# Patient Record
Sex: Female | Born: 1960
Health system: Southern US, Community
[De-identification: ages and names within clinical notes are randomized; demographics above are authoritative.]

## PROBLEM LIST (undated history)

## (undated) DIAGNOSIS — Z86711 Personal history of pulmonary embolism: Secondary | ICD-10-CM

## (undated) DIAGNOSIS — D369 Benign neoplasm, unspecified site: Secondary | ICD-10-CM

## (undated) DIAGNOSIS — D6851 Activated protein C resistance: Secondary | ICD-10-CM

## (undated) DIAGNOSIS — K635 Polyp of colon: Secondary | ICD-10-CM

## (undated) DIAGNOSIS — K8020 Calculus of gallbladder without cholecystitis without obstruction: Secondary | ICD-10-CM

## (undated) DIAGNOSIS — I82409 Acute embolism and thrombosis of unspecified deep veins of unspecified lower extremity: Secondary | ICD-10-CM

## (undated) DIAGNOSIS — E7800 Pure hypercholesterolemia, unspecified: Secondary | ICD-10-CM

## (undated) DIAGNOSIS — I10 Essential (primary) hypertension: Secondary | ICD-10-CM

## (undated) DIAGNOSIS — K602 Anal fissure, unspecified: Secondary | ICD-10-CM

## (undated) DIAGNOSIS — F419 Anxiety disorder, unspecified: Secondary | ICD-10-CM

## (undated) DIAGNOSIS — F338 Other recurrent depressive disorders: Secondary | ICD-10-CM

## (undated) DIAGNOSIS — J189 Pneumonia, unspecified organism: Secondary | ICD-10-CM

## (undated) DIAGNOSIS — K589 Irritable bowel syndrome without diarrhea: Secondary | ICD-10-CM

## (undated) HISTORY — DX: Anxiety disorder, unspecified: F41.9

## (undated) HISTORY — DX: Personal history of pulmonary embolism: Z86.711

## (undated) HISTORY — DX: Benign neoplasm, unspecified site: D36.9

## (undated) HISTORY — DX: Other recurrent depressive disorders: F33.8

## (undated) HISTORY — DX: Calculus of gallbladder without cholecystitis without obstruction: K80.20

## (undated) HISTORY — DX: Activated protein C resistance: D68.51

## (undated) HISTORY — DX: Essential (primary) hypertension: I10

## (undated) HISTORY — PX: POLYPECTOMY: SHX149

## (undated) HISTORY — PX: COLONOSCOPY: SHX174

## (undated) HISTORY — DX: Polyp of colon: K63.5

## (undated) HISTORY — PX: DILATION AND CURETTAGE OF UTERUS: SHX78

## (undated) HISTORY — DX: Pneumonia, unspecified organism: J18.9

## (undated) HISTORY — DX: Anal fissure, unspecified: K60.2

## (undated) HISTORY — DX: Acute embolism and thrombosis of unspecified deep veins of unspecified lower extremity: I82.409

## (undated) HISTORY — DX: Pure hypercholesterolemia, unspecified: E78.00

## (undated) HISTORY — DX: Irritable bowel syndrome, unspecified: K58.9

---
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
## 1998-10-24 HISTORY — PX: APPENDECTOMY: SHX54

## 1998-12-06 ENCOUNTER — Emergency Department (HOSPITAL_COMMUNITY): Admission: EM | Admit: 1998-12-06 | Discharge: 1998-12-06 | Payer: Self-pay | Admitting: Emergency Medicine

## 1998-12-09 ENCOUNTER — Inpatient Hospital Stay (HOSPITAL_COMMUNITY): Admission: EM | Admit: 1998-12-09 | Discharge: 1999-01-03 | Payer: Self-pay | Admitting: Emergency Medicine

## 1998-12-09 ENCOUNTER — Encounter: Payer: Self-pay | Admitting: Emergency Medicine

## 1998-12-10 ENCOUNTER — Encounter (HOSPITAL_BASED_OUTPATIENT_CLINIC_OR_DEPARTMENT_OTHER): Payer: Self-pay | Admitting: General Surgery

## 1998-12-15 ENCOUNTER — Encounter (HOSPITAL_BASED_OUTPATIENT_CLINIC_OR_DEPARTMENT_OTHER): Payer: Self-pay | Admitting: General Surgery

## 1998-12-17 ENCOUNTER — Encounter (HOSPITAL_BASED_OUTPATIENT_CLINIC_OR_DEPARTMENT_OTHER): Payer: Self-pay | Admitting: General Surgery

## 1998-12-21 ENCOUNTER — Encounter (HOSPITAL_BASED_OUTPATIENT_CLINIC_OR_DEPARTMENT_OTHER): Payer: Self-pay | Admitting: General Surgery

## 1998-12-28 ENCOUNTER — Encounter (HOSPITAL_BASED_OUTPATIENT_CLINIC_OR_DEPARTMENT_OTHER): Payer: Self-pay | Admitting: General Surgery

## 1999-01-19 ENCOUNTER — Encounter: Admission: RE | Admit: 1999-01-19 | Discharge: 1999-01-19 | Payer: Self-pay | Admitting: Internal Medicine

## 1999-10-25 HISTORY — PX: INCISIONAL HERNIA REPAIR: SHX193

## 1999-10-25 HISTORY — PX: CHOLECYSTECTOMY: SHX55

## 2000-04-05 ENCOUNTER — Other Ambulatory Visit: Admission: RE | Admit: 2000-04-05 | Discharge: 2000-04-05 | Payer: Self-pay | Admitting: Obstetrics and Gynecology

## 2000-05-08 ENCOUNTER — Encounter: Payer: Self-pay | Admitting: Family Medicine

## 2000-05-08 ENCOUNTER — Encounter: Admission: RE | Admit: 2000-05-08 | Discharge: 2000-05-08 | Payer: Self-pay | Admitting: Family Medicine

## 2000-05-10 ENCOUNTER — Encounter: Payer: Self-pay | Admitting: Obstetrics and Gynecology

## 2000-05-10 ENCOUNTER — Ambulatory Visit (HOSPITAL_COMMUNITY): Admission: RE | Admit: 2000-05-10 | Discharge: 2000-05-10 | Payer: Self-pay | Admitting: Obstetrics and Gynecology

## 2000-05-18 ENCOUNTER — Encounter (INDEPENDENT_AMBULATORY_CARE_PROVIDER_SITE_OTHER): Payer: Self-pay | Admitting: Specialist

## 2000-05-18 ENCOUNTER — Observation Stay (HOSPITAL_COMMUNITY): Admission: RE | Admit: 2000-05-18 | Discharge: 2000-05-19 | Payer: Self-pay

## 2000-10-24 HISTORY — PX: OOPHORECTOMY: SHX86

## 2001-02-06 ENCOUNTER — Ambulatory Visit: Admission: RE | Admit: 2001-02-06 | Discharge: 2001-02-06 | Payer: Self-pay | Admitting: Gynecologic Oncology

## 2001-02-27 ENCOUNTER — Ambulatory Visit (HOSPITAL_COMMUNITY): Admission: RE | Admit: 2001-02-27 | Discharge: 2001-02-27 | Payer: Self-pay | Admitting: Gynecologic Oncology

## 2001-02-27 ENCOUNTER — Encounter (INDEPENDENT_AMBULATORY_CARE_PROVIDER_SITE_OTHER): Payer: Self-pay

## 2001-06-18 ENCOUNTER — Other Ambulatory Visit: Admission: RE | Admit: 2001-06-18 | Discharge: 2001-06-18 | Payer: Self-pay | Admitting: Obstetrics and Gynecology

## 2001-07-20 ENCOUNTER — Ambulatory Visit (HOSPITAL_COMMUNITY): Admission: RE | Admit: 2001-07-20 | Discharge: 2001-07-20 | Payer: Self-pay | Admitting: Obstetrics and Gynecology

## 2001-07-20 ENCOUNTER — Encounter: Payer: Self-pay | Admitting: Obstetrics and Gynecology

## 2003-03-13 ENCOUNTER — Encounter: Payer: Self-pay | Admitting: Obstetrics and Gynecology

## 2003-03-13 ENCOUNTER — Ambulatory Visit (HOSPITAL_COMMUNITY): Admission: RE | Admit: 2003-03-13 | Discharge: 2003-03-13 | Payer: Self-pay | Admitting: Obstetrics and Gynecology

## 2003-03-19 ENCOUNTER — Other Ambulatory Visit: Admission: RE | Admit: 2003-03-19 | Discharge: 2003-03-19 | Payer: Self-pay | Admitting: Obstetrics and Gynecology

## 2004-07-09 ENCOUNTER — Ambulatory Visit (HOSPITAL_COMMUNITY): Admission: RE | Admit: 2004-07-09 | Discharge: 2004-07-09 | Payer: Self-pay | Admitting: Obstetrics and Gynecology

## 2004-08-03 ENCOUNTER — Other Ambulatory Visit: Admission: RE | Admit: 2004-08-03 | Discharge: 2004-08-03 | Payer: Self-pay | Admitting: Obstetrics and Gynecology

## 2006-11-27 ENCOUNTER — Ambulatory Visit (HOSPITAL_COMMUNITY): Admission: RE | Admit: 2006-11-27 | Discharge: 2006-11-27 | Payer: Self-pay | Admitting: Obstetrics and Gynecology

## 2007-01-23 ENCOUNTER — Other Ambulatory Visit: Admission: RE | Admit: 2007-01-23 | Discharge: 2007-01-23 | Payer: Self-pay | Admitting: Obstetrics and Gynecology

## 2008-01-25 ENCOUNTER — Other Ambulatory Visit: Admission: RE | Admit: 2008-01-25 | Discharge: 2008-01-25 | Payer: Self-pay | Admitting: Obstetrics and Gynecology

## 2010-08-03 ENCOUNTER — Ambulatory Visit (HOSPITAL_COMMUNITY): Admission: RE | Admit: 2010-08-03 | Discharge: 2010-08-03 | Payer: Self-pay | Admitting: Obstetrics and Gynecology

## 2011-03-11 NOTE — Consult Note (Signed)
[facility]  Patient:    Kaitlyn Garcia, Kaitlyn Garcia                        MRN: [id number] Proc. Date: 02/06/01 Adm. Date:  [id number] Attending:  Ronita Hipps T CC:         Edwena Felty. Ashley Royalty, M.D., [facility], [address],             [address], [place], [place]  [address]  Telford Nab, R.N., GYN Oncology, [facility]   Consultation Report  HISTORY OF PRESENT ILLNESS:  The patient presents for a second opinion consultation regarding management of leiomyomata and an adnexal cyst.  This 50 year old para 3 woman presented with symptoms of intermittent discomfort and for routine examination.  Findings were negative but ultrasound revealed fibroid uterus and a 3.8-cm septated cyst.  CA125 value was normal. The patient has a friend who had an ovarian malignancy and is extremely anxious regarding these findings.  Per the ultrasound report, she had three fibroids, with the largest measuring 14 x 13 mm, and the ovarian cyst was smoothly marginated without mural nodularity.  PAST SURGICAL HISTORY:  Past history is significant for T&A, D&C, ruptured appendix, with repairs of incisional herniae including mesh.  PAST MEDICAL HISTORY:  Past medical history is significant for no major co-morbidities.  MEDICATIONS:  Medications include Modicon.  ALLERGIES:  None known.  FAMILY HISTORY:  Family history is significant for diabetes and MI but no gynecologic malignancy.  PERSONAL/SOCIAL HISTORY:  The patient denies tobacco or ethanol use, married.  REVIEW OF SYSTEMS:  Review of systems otherwise negative.  PHYSICAL EXAMINATION:  VITAL SIGNS:  Vital signs stable and afebrile as recorded in the clinic flow sheet.  GENERAL:  The patient is alert and oriented x 3.  ENT:  Benign.  LYMPHATICS:  There is no pathologic lymphadenopathy.  BACK:  There is no back or CVA tenderness.  ABDOMEN:  The abdomen is soft and benign with well-healed midline  and transverse incisions without palpable hernia but with depressed scars.  EXTREMITIES:  There is no lymphedema and extremities have full range of motion.  PELVIC:  External genitalia and BUS are normal to inspection and palpation. The bladder is well-supported and there are no vaginal lesions.  There is no cervical motion tenderness or lesions of the cervix.  Bimanual and rectovaginal examinations reveal upper-limit-in-size uterus and I am unable to palpate an adnexal mass.  There is no adnexal tenderness.  Rectovaginal examination is confirmatory.  ASSESSMENT: 1. Small adnexal cyst with small fibroids. 2. Prior peritonitis and multiple abdominopelvic surgeries.  RECOMMENDATIONS:  I had a lengthy discussion with the patient regarding management options.  I believe that her cyst is likely benign, with less than 1% chance of malignancy based on its findings.  I recommended a followup ultrasound (already scheduled) and if the cyst is stable in characteristics, this could be followed without surgery, but if it is enlarging or has worsening characteristics, symptoms, or she is extremely concerned regarding the etiology of the cyst, at least salpingo-oophorectomy should be performed. She would require a major laparotomy and has a chance for bowel morbidity from this procedure.  The patient appeared satisfied with this recommendation and will follow up with Dr. Edwena Felty. Ashley Royalty as scheduled. DD:  02/06/01 TD:  02/07/01 Job: [id number]

## 2011-03-11 NOTE — Op Note (Signed)
[facility]  Patient:    Kaitlyn Garcia, Kaitlyn Garcia                        MRN: [id number] Proc. Date: 05/18/00 Adm. Date:  [id number] Disc. Date: [id number] Attending:  Gennie Alma CC:         Evette Georges, M.D. [facility]                           Operative Report  CCS#:  [id number]  PREOPERATIVE DIAGNOSIS:  Chronic cholecystitis and cholelithiasis.  POSTOPERATIVE DIAGNOSIS:  Chronic cholecystitis and cholelithiasis.  OPERATION PERFORMED:  Laparoscopic cholecystectomy and intraoperative cholangiogram.  SURGEON:  Dr. Orpah Greek.  ANESTHESIA:  General endotracheal.  DESCRIPTION OF PROCEDURE:  Under adequate general endotracheal anesthesia, the patients abdomen was prepped and draped in the usual fashion. A vertical incision was made above the umbilicus and carried down to the deep fascia which was incised longitudinally. The peritoneum was entered and a Hasson cannula was introduced into the abdomen under direct vision. The abdomen was inflated with carbon dioxide to a filling pressure of 15 mmHg with an automatic insufflator. The laparoscope was then introduced. Using this for direct vision, an upper midline 10 ml disposable port was inserted and 2 subcostal 5 mm ports. The gallbladder was then meshed in multiple adhesions which were serially taken down over endoclips. When the gallbladder was completely exposed, dissection was begun in the triangle of Calot. A fairly long cystic duct was found that was moderate size. It was skeletonized and then clipped at the gallbladder cystic duct junction. An incision was made in the cystic duct with the scissors and there were multiple small bleeding arteries around the cystic duct that were electrocoagulated.  A 16 gauge Angiocath was inserted into the abdomen in the right upper quadrant and a Reddick catheter was inserted into the abdomen through the Angiocath. It was threaded into the cystic duct and held in place  with an endoclip. An intraoperative cholangiogram was then done. This revealed a normal size common bile duct and good flow of dye into the duodenum, no intraluminal filling defects. Approximately 10 cc of 25% Hypaque was used and C-arm fluoroscopy.  After the cholangiogram was done, the clip was removed from the cystic duct and the Reddick catheter withdrawn. The cystic duct was triply clipped with endoclips below the incision in it and it was divided. The cystic artery was isolated and skeletonized, triply clipped with endoclips and transected between the distal 2 clips. The gallbladder was dissected out of the gallbladder bed of the liver. There were significant adhesions around it and there was 1 entrance made into the gallbladder in the mid portion. No stones escaped but there was bile that ran out of it and it was aspirated and irrigated.  The gallbladder was dissected out of the gallbladder bed of the liver with hook and spatula coagulation. It was completely detached. Hemostasis was ascertained. The gallbladder was placed in an endocatch bag which was withdrawn through the umbilical port. The umbilical port was reinserted and the subhepatic and subphrenic spaces were irrigated with sterile saline solution until clear. Hemostasis was ascertained. The subcostal ports were withdrawn under direct vision. The umbilical port was withdrawn and the fascia was closed with interrupted sutures of #0 PDS. The upper midline port was removed after checking the undersurface of the umbilical port closure. The subcuticular layer of the port incisions was closed  with interrupted sutures of 5-0 Vicryl. Half incision Steri-Strips were applied to the skin and sterile dressings applied. Estimated blood loss for the procedure was approximately 150 cc. The patient tolerated the procedure well and left the operating room in satisfactory condition. DD:  05/18/00 TD:  05/20/00 Job: [id number]

## 2011-03-11 NOTE — Op Note (Signed)
[facility]  Patient:    Kaitlyn Garcia, Kaitlyn Garcia                        MRN: [id number] Proc. Date: 02/27/01 Adm. Date:  [id number] Disc. Date: [id number] Attending:  Brynda Peon                           Operative Report  PREOPERATIVE DIAGNOSIS:  Left ovarian mass.  POSTOPERATIVE DIAGNOSIS:  Left ovarian mass.  PATHOLOGY:  Pending.  PROCEDURE:  Laparoscopic left salpingo-oophorectomy.  CO-SURGEONS:  Dr. Altamese Dilling and Dr. Ronita Hipps.  ANESTHESIA:  General endotracheal.  ESTIMATED BLOOD LOSS:  50 cc.  COMPLICATIONS:  None.  DESCRIPTION OF PROCEDURE:  The patient was taken to the operating room and after the induction of adequate general endotracheal anesthesia was placed in the low dorsal lithotomy position and prepped and draped in the usual fashion. A Hulka uterine manipulator was placed, the bladder was drained with a red rubber catheter. A vertical subumbilical incision was made. The dissection was sharply carried down through the peritoneum and the open laparoscopic trocar inserted and stitched. The laparoscope was inserted, the pelvis was inspected. There were amazingly few adhesions given the patients history of a ruptured appendix requiring a 26 day hospital stay after her appendectomy. The right tube and ovary appeared normal. The uterus was of normal size. There was a small approximately 2 cm fundal subserosal fibroid on the left cornu. In the left adnexa, there was a complex mass consistent with the mass seen previously on ultrasound. It was adherent to the peritoneum inferiorly and posteriorly. Lysis of adhesions was carried out and the left ureter was identified as was the infundibulopelvic ligament. The abdomen was inspected and two 5 mm incisions were made in the lateral corners of the patients already existing transverse scar and 5 mm trocars were inserted under direct visualization. A midline suprapubic incision was made  and a 12 mm disposable trocar was also inserted under direct visualization. The stapler was used with the 12 mm port to staple across the infundibulopelvic ligament which was hemostatic after the stapling. The bipolar cautery and Pirozzi scissors were used to dissect the mass free adherent to the peritoneum. The stapler was also used in two sections to staple across the attachment of this mass to the uterus being across the pedicle that was the tube. The round ligament had been previously transected after cauterizing it. When the mass was free, it was inserted into an endocatch pouch and was pulled out through the suprapubic incision and sent to pathology for frozen section. Frozen section did reveal a benign ovarian cyst. The pelvis was then inspected, the nezhat irrigator aspirator was used for irrigation and the pelvis was felt to be hemostatic. There was an area on the uterus where the small fibroid had been used for traction and it had pulled off the uterus and this was bleeding a small amount and this area was cauterized with the bipolar cautery with good effect. The pelvis was then felt to be hemostatic. The instruments were removed. The pneumoperitoneum was allowed to escape. The fascia was closed in the umbilical and the suprapubic incision using #0 Vicryl. The skin was closed subcuticularly with 3-0 Vicryl ______.  The incisions were injected with 0.5% Marcaine with epinephrine for a total of approximately 10 cc. Steri-Strips were applied. The instruments were removed from the vagina and the procedure was  terminated. The patient tolerated it well and went in satisfactory condition to post anesthesia recover. DD:  02/27/01 TD:  02/27/01 Job: [id number]

## 2011-10-20 ENCOUNTER — Encounter: Payer: Self-pay | Admitting: Internal Medicine

## 2011-11-11 ENCOUNTER — Encounter: Payer: Self-pay | Admitting: Internal Medicine

## 2011-11-11 ENCOUNTER — Ambulatory Visit (AMBULATORY_SURGERY_CENTER): Payer: 59 | Admitting: *Deleted

## 2011-11-11 VITALS — Ht 64.0 in | Wt 151.0 lb

## 2011-11-11 DIAGNOSIS — Z1211 Encounter for screening for malignant neoplasm of colon: Secondary | ICD-10-CM

## 2011-11-11 MED ORDER — PEG-KCL-NACL-NASULF-NA ASC-C 100 G PO SOLR: 1.0000 | Freq: Once | ORAL | Status: DC

## 2011-11-25 ENCOUNTER — Ambulatory Visit (AMBULATORY_SURGERY_CENTER): Payer: 59 | Admitting: Internal Medicine

## 2011-11-25 ENCOUNTER — Encounter: Payer: Self-pay | Admitting: Internal Medicine

## 2011-11-25 VITALS — BP 140/81 | HR 78 | Temp 98.3°F | Resp 18 | Ht 64.0 in | Wt 151.0 lb

## 2011-11-25 DIAGNOSIS — Z1211 Encounter for screening for malignant neoplasm of colon: Secondary | ICD-10-CM

## 2011-11-25 DIAGNOSIS — D126 Benign neoplasm of colon, unspecified: Secondary | ICD-10-CM

## 2011-11-25 MED ORDER — SODIUM CHLORIDE 0.9 % IV SOLN: 500.0000 mL | INTRAVENOUS | Status: DC

## 2011-11-25 NOTE — Patient Instructions (Signed)
Your polyp results will be mailed to your home within two weeks.   You need to increase the fiber in your diet due to your diverticulosis and hemorrhoids.   You may resume your routine medications today.   Please, read the handouts given to you by your recovery room nurse.  If you have any questions, please call us at [phone].   Thank-you.

## 2011-11-25 NOTE — Op Note (Signed)
[facility] [address] [place], [place]  [address]  COLONOSCOPY PROCEDURE REPORT  PATIENT:  Kaitlyn Garcia, Kaitlyn Garcia  MR#:  [id number] BIRTHDATE:  08-18-1961, 50 yrs. old  GENDER:  female ENDOSCOPIST:  Carie Caddy. Makari Portman, MD REF. BY:  Meredeth Ide, M.D. Merri Brunette, M.D. PROCEDURE DATE:  11/25/2011 PROCEDURE:  Colonoscopy with snare polypectomy, Colon with cold biopsy polypectomy ASA CLASS:  Class I INDICATIONS:  Routine Risk Screening MEDICATIONS:   These medications were titrated to patient response per physician's verbal order, Versed 10 mg IV, Fentanyl 100 mcg IV  DESCRIPTION OF PROCEDURE:   After the risks benefits and alternatives of the procedure were thoroughly explained, informed consent was obtained.  Digital rectal exam was performed and revealed no rectal masses.   The LB PCF-H180AL [id number] endoscope was introduced through the anus and advanced to the terminal ileum which was intubated for a short distance, without limitations. The quality of the prep was good, using MoviPrep.  The instrument was then slowly withdrawn as the colon was fully examined. <<PROCEDUREIMAGES>>  FINDINGS:  The terminal ileum appeared normal.  A 3 mm sessile polyp was found in the proximal transverse colon. The polyp was removed using cold biopsy forceps.  A 6 mm sessile/flat polyp was found in the mid transverse colon. Polyp was snared without cautery. Retrieval was successful.   Mild diverticulosis was found in the sigmoid colon.  Small internal hemorrhoids were found. Retroflexed views in the rectum revealed no other findings other than those already described.  The scope was then withdrawn from the cecum and the procedure completed.  COMPLICATIONS:  None ENDOSCOPIC IMPRESSION: 1) Normal terminal ileum 2) Sessile polyp in the proximal transverse colon.  Removed and sent to pathology. 3) Sessile polyp in the mid transverse colon.  Removed and sent to pathology. 4) Mild diverticulosis  in the sigmoid colon 5) Small internal hemorrhoids  RECOMMENDATIONS: 1) Await pathology results 2) High fiber diet. 3) If the polyps removed today are proven to be adenomatous (pre-cancerous) polyps, you will need a repeat colonoscopy in 5 years. Otherwise you should continue to follow colorectal cancer screening guidelines for "routine risk" patients with colonoscopy in 10 years. 4) You will receive a letter within 1-2 weeks with the results of your biopsy as well as final recommendations. Please call my office if you have not received a letter after 3 weeks.  Carie Caddy. Rhea Belton, MD  CC:  The Patient Meredeth Ide, MD Merri Brunette, MD  n. Rosalie DoctorCarie Caddy. Alyric Parkin at 11/25/2011 02:17 PM  Randa Evens, [phone]

## 2011-11-25 NOTE — Progress Notes (Signed)
Patient did not have preoperative order for IV antibiotic SSI prophylaxis. (G8918)  Patient did not experience any of the following events: a burn prior to discharge; a fall within the facility; wrong site/side/patient/procedure/implant event; or a hospital transfer or hospital admission upon discharge from the facility. (G8907)  

## 2011-11-28 ENCOUNTER — Telehealth: Payer: Self-pay | Admitting: *Deleted

## 2011-11-28 NOTE — Telephone Encounter (Signed)
  Follow up Call-  Call back number 11/25/2011  Post procedure Call Back phone  # [phone]  Permission to leave phone message Yes     No answer,left message.

## 2011-12-03 ENCOUNTER — Encounter: Payer: Self-pay | Admitting: Internal Medicine

## 2012-02-27 ENCOUNTER — Other Ambulatory Visit: Payer: Self-pay | Admitting: Plastic Surgery

## 2012-10-30 ENCOUNTER — Other Ambulatory Visit: Payer: Self-pay | Admitting: Obstetrics and Gynecology

## 2012-10-30 DIAGNOSIS — Z1231 Encounter for screening mammogram for malignant neoplasm of breast: Secondary | ICD-10-CM

## 2012-11-08 ENCOUNTER — Ambulatory Visit (HOSPITAL_COMMUNITY)
Admission: RE | Admit: 2012-11-08 | Discharge: 2012-11-08 | Disposition: A | Discharge status: Home / Self Care | Payer: 59 | Source: Ambulatory Visit | Attending: Obstetrics and Gynecology | Admitting: Obstetrics and Gynecology

## 2012-11-08 DIAGNOSIS — Z1231 Encounter for screening mammogram for malignant neoplasm of breast: Secondary | ICD-10-CM | POA: Insufficient documentation

## 2012-11-26 ENCOUNTER — Other Ambulatory Visit: Payer: Self-pay | Admitting: Obstetrics and Gynecology

## 2012-11-26 DIAGNOSIS — R928 Other abnormal and inconclusive findings on diagnostic imaging of breast: Secondary | ICD-10-CM

## 2012-12-07 ENCOUNTER — Other Ambulatory Visit: Payer: 59

## 2012-12-10 ENCOUNTER — Ambulatory Visit
Admission: RE | Admit: 2012-12-10 | Discharge: 2012-12-10 | Disposition: A | Discharge status: Home / Self Care | Payer: 59 | Source: Ambulatory Visit | Attending: Obstetrics and Gynecology | Admitting: Obstetrics and Gynecology

## 2012-12-10 DIAGNOSIS — R928 Other abnormal and inconclusive findings on diagnostic imaging of breast: Secondary | ICD-10-CM

## 2013-07-17 DIAGNOSIS — M25579 Pain in unspecified ankle and joints of unspecified foot: Secondary | ICD-10-CM | POA: Insufficient documentation

## 2013-11-08 ENCOUNTER — Encounter: Payer: Self-pay | Admitting: Obstetrics and Gynecology

## 2013-11-08 ENCOUNTER — Ambulatory Visit: Payer: Self-pay | Admitting: Obstetrics and Gynecology

## 2013-11-08 ENCOUNTER — Ambulatory Visit (INDEPENDENT_AMBULATORY_CARE_PROVIDER_SITE_OTHER): Payer: 59 | Admitting: Obstetrics and Gynecology

## 2013-11-08 VITALS — BP 142/90 | HR 80 | Resp 16 | Ht 64.0 in | Wt 160.0 lb

## 2013-11-08 DIAGNOSIS — E559 Vitamin D deficiency, unspecified: Secondary | ICD-10-CM

## 2013-11-08 DIAGNOSIS — Z01419 Encounter for gynecological examination (general) (routine) without abnormal findings: Secondary | ICD-10-CM

## 2013-11-08 DIAGNOSIS — Z23 Encounter for immunization: Secondary | ICD-10-CM

## 2013-11-08 DIAGNOSIS — Z0000 Encounter for general adult medical examination without abnormal findings: Secondary | ICD-10-CM

## 2013-11-08 LAB — CBC
HEMATOCRIT: 42.5 % (ref 36.0–46.0)
Hemoglobin: 14.3 g/dL (ref 12.0–15.0)
MCH: 27.8 pg (ref 26.0–34.0)
MCHC: 33.6 g/dL (ref 30.0–36.0)
MCV: 82.7 fL (ref 78.0–100.0)
Platelets: 295 10*3/uL (ref 150–400)
RBC: 5.14 MIL/uL — ABNORMAL HIGH (ref 3.87–5.11)
RDW: 14.1 % (ref 11.5–15.5)
WBC: 6.1 10*3/uL (ref 4.0–10.5)

## 2013-11-08 LAB — POCT URINALYSIS DIPSTICK
BILIRUBIN UA: NEGATIVE
GLUCOSE UA: NEGATIVE
KETONES UA: NEGATIVE
Leukocytes, UA: NEGATIVE
Nitrite, UA: NEGATIVE
PH UA: 5
PROTEIN UA: NEGATIVE
RBC UA: NEGATIVE
UROBILINOGEN UA: NEGATIVE

## 2013-11-08 LAB — HEMOGLOBIN, FINGERSTICK: HEMOGLOBIN, FINGERSTICK: 14.7 g/dL (ref 12.0–16.0)

## 2013-11-08 NOTE — Progress Notes (Signed)
GYNECOLOGY VISIT  PCP: Carol Ada, MD  Referring provider:   HPI: 53 y.o.   Married  Caucasian  female   [phone] with No LMP recorded. Patient is postmenopausal.   here for   Annual Exam BP today 142/90. Has gained weight back.  Wants vit D check. On OTC vit D.  Hgb:  14.7 Urine:  neg  GYNECOLOGIC HISTORY: No LMP recorded. Patient is postmenopausal. Sexually active:  yes Partner preference: female Contraception:  Menopausal  Menopausal hormone therapy: no DES exposure:  no  Blood transfusions:   no Sexually transmitted diseases: no   GYN Procedures:  Laparoscopic LSO - serous cystadenoma, D&C , Colpo/BX CIN 1 11/2010 Mammogram:   11/08/12, repeat 12/10/12 neg              Pap: 11/02/12 neg   History of abnormal pap smear:  12/15/10 Asc-us HPV Neg, Colpo CIN 1.   History of positive HR HPV and ASCUS pap in 2011.    OB History   Grav Para Term Preterm Abortions TAB SAB Ect Mult Living   3 2   1  1   2        LIFESTYLE: Exercise:   Walking 2-3 days a week            Tobacco: no Alcohol: 3-4 glasses of wine a week Drug use:  no  OTHER HEALTH MAINTENANCE: Tetanus/TDap:? 2002 Gardisil: no Influenza:  no Zostavax: no  Bone density:never Colonoscopy: 11/2011 (1) repeat in 5 years  Cholesterol check: 06/2010 slightly elevated  Family History  Problem Relation Age of Onset  . Colon cancer Neg Hx   . Esophageal cancer Neg Hx   . Stomach cancer Neg Hx   . Rectal cancer Neg Hx   . Hypertension Mother   . Osteoarthritis Mother   . Diabetes Father   . Hypertension Father     There are no active problems to display for this patient.  Past Medical History  Diagnosis Date  . Seasonal affective disorder   . Adenoma     Past Surgical History  Procedure Laterality Date  . Appendectomy    . Cholecystectomy    . Tonsillectomy    . Incisional hernia repair    . Oophorectomy Left 2002  . Dilation and curettage of uterus      ALLERGIES: Review of patient's  allergies indicates no known allergies.  Current Outpatient Prescriptions  Medication Sig Dispense Refill  . B Complex Vitamins (VITAMIN B COMPLEX PO) Take 1 capsule by mouth daily.      . Ibuprofen (ADVIL PO) Take 1 tablet by mouth as needed.      . NON FORMULARY Juice Plus 4 capsules daily       No current facility-administered medications for this visit.     ROS:  Pertinent items are noted in HPI.  SOCIAL HISTORY:  Physicist, medical.   PHYSICAL EXAMINATION:    BP 142/90  Pulse 80  Resp 16  Ht 5\' 4"  (1.626 m)  Wt 160 lb (72.576 kg)  BMI 27.45 kg/m2   Wt Readings from Last 3 Encounters:  11/08/13 160 lb (72.576 kg)  11/25/11 151 lb (68.493 kg)  11/11/11 151 lb (68.493 kg)     Ht Readings from Last 3 Encounters:  11/08/13 5\' 4"  (1.626 m)  11/25/11 5\' 4"  (1.626 m)  11/11/11 5\' 4"  (1.626 m)    General appearance: alert, cooperative and appears stated age Head: Normocephalic, without obvious abnormality, atraumatic Neck: no adenopathy, supple,  symmetrical, trachea midline and thyroid not enlarged, symmetric, no tenderness/mass/nodules Lungs: clear to auscultation bilaterally Breasts: Inspection negative, No nipple retraction or dimpling, No nipple discharge or bleeding, No axillary or supraclavicular adenopathy, Normal to palpation without dominant masses Heart: regular rate and rhythm Abdomen: soft, non-tender; no masses,  no organomegaly, vertical midline and transverse incisions.  Small umbilical hernia.  Extremities: extremities normal, atraumatic, no cyanosis or edema Skin: Skin color, texture, turgor normal. No rashes or lesions Lymph nodes: Cervical, supraclavicular, and axillary nodes normal. No abnormal inguinal nodes palpated Neurologic: Grossly normal  Pelvic: External genitalia:  no lesions              Urethra:  normal appearing urethra with no masses, tenderness or lesions              Bartholins and Skenes: normal                 Vagina: normal appearing  vagina with normal color and discharge, no lesions              Cervix: normal appearance              Pap and high risk HPV testing done: yes.            Bimanual Exam:  Uterus:  uterus is normal size, shape, consistency and nontender                                      Adnexa: normal adnexa in size, nontender and no masses                                      Rectovaginal: Confirms                                      Anus:  normal sphincter tone, no lesions  ASSESSMENT  Normal gynecologic exam. History of LGSIL. Vit D deficiency. Need for TDap Borderline elevated BP.  Weight gain.  Small umbilical hernia.   PLAN  [facility] at [facility].  Patient will call.  Pap smear and high risk HPV testing performed. CBC, CMP, lipid profile, TSH, Vit D. TDap Encouraged weight loss.  Signs of intestinal obstruction in hernia reviewed. Return annually or prn   An After Visit Summary was printed and given to the patient.

## 2013-11-08 NOTE — Patient Instructions (Signed)

## 2013-11-09 LAB — LIPID PANEL
CHOL/HDL RATIO: 3.4 ratio
CHOLESTEROL: 216 mg/dL — AB (ref 0–200)
HDL: 63 mg/dL (ref >39)
LDL CALC: 135 mg/dL — AB (ref 0–99)
Triglycerides: 92 mg/dL (ref <150)
VLDL: 18 mg/dL (ref 0–40)

## 2013-11-09 LAB — COMPREHENSIVE METABOLIC PANEL
ALBUMIN: 4.8 g/dL (ref 3.5–5.2)
ALK PHOS: 80 U/L (ref 39–117)
ALT: 28 U/L (ref 0–35)
AST: 18 U/L (ref 0–37)
BUN: 13 mg/dL (ref 6–23)
CHLORIDE: 103 meq/L (ref 96–112)
CO2: 27 meq/L (ref 19–32)
CREATININE: 0.77 mg/dL (ref 0.50–1.10)
Calcium: 10 mg/dL (ref 8.4–10.5)
GLUCOSE: 84 mg/dL (ref 70–99)
Potassium: 3.8 mEq/L (ref 3.5–5.3)
Sodium: 140 mEq/L (ref 135–145)
Total Bilirubin: 1.1 mg/dL (ref 0.3–1.2)
Total Protein: 7.5 g/dL (ref 6.0–8.3)

## 2013-11-09 LAB — VITAMIN D 25 HYDROXY (VIT D DEFICIENCY, FRACTURES): Vit D, 25-Hydroxy: 35 ng/mL (ref 30–89)

## 2013-11-09 LAB — TSH: TSH: 2.942 u[IU]/mL (ref 0.350–4.500)

## 2013-11-12 LAB — IPS PAP TEST WITH HPV

## 2014-08-25 ENCOUNTER — Encounter: Payer: Self-pay | Admitting: Obstetrics and Gynecology

## 2014-11-10 ENCOUNTER — Ambulatory Visit: Payer: 59 | Admitting: Obstetrics and Gynecology

## 2014-11-12 ENCOUNTER — Other Ambulatory Visit: Payer: Self-pay | Admitting: Obstetrics and Gynecology

## 2014-11-12 DIAGNOSIS — Z1231 Encounter for screening mammogram for malignant neoplasm of breast: Secondary | ICD-10-CM

## 2014-11-19 ENCOUNTER — Ambulatory Visit
Admission: RE | Admit: 2014-11-19 | Discharge: 2014-11-19 | Disposition: A | Discharge status: Home / Self Care | Payer: 59 | Source: Ambulatory Visit | Attending: Obstetrics and Gynecology | Admitting: Obstetrics and Gynecology

## 2014-11-19 ENCOUNTER — Telehealth: Payer: Self-pay | Admitting: Obstetrics and Gynecology

## 2014-11-19 DIAGNOSIS — Z1231 Encounter for screening mammogram for malignant neoplasm of breast: Secondary | ICD-10-CM

## 2014-11-19 NOTE — Telephone Encounter (Signed)
Spoke with patient. Advised patient will need to be seen in office for evaluation before imaging can be ordered. Patient is agreeable. States that she noticed the breast lump a little while back and just wanted to get it checked out. Denies redness or warmth to breast. Offered to move aex up to tomorrow with Milford Cage, FNP but patient declines. "I am not feeling so well. I think I will just keep my Friday appointment." Advised patient if she would like to move appointment up just to give [place] a call. Patient is agreeable.  Routing to provider for final review. Patient agreeable to disposition. Will close encounter

## 2014-11-19 NOTE — Telephone Encounter (Signed)
Patient found a breast lump and called the breast center. Patient was told to call our office. Patient has an aex 11/21/14 and was hoping to get her MMG before 11/21/14. Last seen 11/08/13

## 2014-11-21 ENCOUNTER — Ambulatory Visit (INDEPENDENT_AMBULATORY_CARE_PROVIDER_SITE_OTHER): Payer: 59 | Admitting: Nurse Practitioner

## 2014-11-21 ENCOUNTER — Encounter: Payer: Self-pay | Admitting: Nurse Practitioner

## 2014-11-21 ENCOUNTER — Other Ambulatory Visit: Payer: Self-pay | Admitting: Nurse Practitioner

## 2014-11-21 ENCOUNTER — Ambulatory Visit: Payer: 59 | Admitting: Obstetrics and Gynecology

## 2014-11-21 VITALS — BP 120/80 | HR 76 | Ht 64.0 in | Wt 155.0 lb

## 2014-11-21 DIAGNOSIS — Z01419 Encounter for gynecological examination (general) (routine) without abnormal findings: Secondary | ICD-10-CM

## 2014-11-21 DIAGNOSIS — Z0000 Encounter for general adult medical examination without abnormal findings: Secondary | ICD-10-CM

## 2014-11-21 DIAGNOSIS — N6310 Unspecified lump in the right breast, unspecified quadrant: Secondary | ICD-10-CM

## 2014-11-21 DIAGNOSIS — Z1211 Encounter for screening for malignant neoplasm of colon: Secondary | ICD-10-CM

## 2014-11-21 DIAGNOSIS — E559 Vitamin D deficiency, unspecified: Secondary | ICD-10-CM

## 2014-11-21 DIAGNOSIS — N63 Unspecified lump in breast: Secondary | ICD-10-CM

## 2014-11-21 LAB — POCT URINALYSIS DIPSTICK
Bilirubin, UA: NEGATIVE
Glucose, UA: NEGATIVE
KETONES UA: NEGATIVE
Leukocytes, UA: NEGATIVE
Nitrite, UA: NEGATIVE
PH UA: 6
Protein, UA: NEGATIVE
RBC UA: NEGATIVE
Urobilinogen, UA: NEGATIVE

## 2014-11-21 LAB — HEMOGLOBIN, FINGERSTICK: HEMOGLOBIN, FINGERSTICK: 13.2 g/dL (ref 12.0–16.0)

## 2014-11-21 NOTE — Progress Notes (Signed)
Patient ID: Kaitlyn Garcia, female   DOB: 1961/10/20, 54 y.o.   MRN: [id number] 54 y.o. [id number] Married  Caucasian Fe here for annual exam.  Right breast mass noted earlier this week.  Tried to get Mammogram ans they would not let her schedule.  She is postmenopausal and not on HRT.  No [place] of breast cancer.  Patient's last menstrual period was 01/24/2008 (approximate).  Pt is postmenopausal.        Sexually active: Yes.    The current method of family planning is post menopausal status.    Exercising: No.  Occasional walking when active, 3-4 miles. Smoker:  no  Health Maintenance: Pap:  11/08/13, negative with neg HR HPV;  12/15/10 ASCUS HPV Neg, Colpo CIN 1.   History of positive HR HPV and ASCUS pap in 2011 MMG:  11/08/12 with diagnostic left on 12/10/12, Bi-Rads 1:  Negative; had MMG scheduled for 2016, but was unable to do because she had found a lump during SBE. Colonoscopy:  11/25/11, tubular adenoma, repeat 5 years TDaP:  07/23/10 Labs:  HB:  13.2  Urine:  Negative    reports that she has never smoked. She has never used smokeless tobacco. She reports that she drinks about 2.4 oz of alcohol per week. She reports that she does not use illicit drugs.  Past Medical History  Diagnosis Date  . Seasonal affective disorder   . Adenoma     Past Surgical History  Procedure Laterality Date  . Appendectomy    . Cholecystectomy    . Tonsillectomy    . Incisional hernia repair    . Oophorectomy Left 2002  . Dilation and curettage of uterus      Current Outpatient Prescriptions  Medication Sig Dispense Refill  . B Complex Vitamins (VITAMIN B COMPLEX PO) Take 1 capsule by mouth daily.    . Ibuprofen (ADVIL PO) Take 1 tablet by mouth as needed.    . Probiotic Product (PROBIOTIC DAILY PO) Take 1 capsule by mouth daily.     No current facility-administered medications for this visit.    Family History  Problem Relation Age of Onset  . Colon cancer Neg Hx   . Esophageal cancer Neg Hx   .  Stomach cancer Neg Hx   . Rectal cancer Neg Hx   . Hypertension Mother   . Osteoarthritis Mother   . Diabetes Father   . Hypertension Father     ROS:  Pertinent items are noted in HPI.  Otherwise, a comprehensive ROS was negative.  Exam:   BP 120/80 mmHg  Pulse 76  Ht 5\' 4"  (1.626 m)  Wt 155 lb (70.308 kg)  BMI 26.59 kg/m2  LMP 01/24/2008 (Approximate) Height: 5\' 4"  (162.6 cm) Ht Readings from Last 3 Encounters:  11/21/14 5\' 4"  (1.626 m)  11/08/13 5\' 4"  (1.626 m)  11/25/11 5\' 4"  (1.626 m)    General appearance: alert, cooperative and appears stated age Head: Normocephalic, without obvious abnormality, atraumatic Neck: no adenopathy, supple, symmetrical, trachea midline and thyroid normal to inspection and palpation Lungs: clear to auscultation bilaterally Breasts: normal appearance, no masses or tenderness, positive findings: mass right breast at 10:00 position 2 cm X 2cm.  Hard to get linear margins.  Mobile, no nipple discharge, no lymph nodes. Heart: regular rate and rhythm Abdomen: soft, non-tender; no masses,  no organomegaly Extremities: extremities normal, atraumatic, no cyanosis or edema Skin: Skin color, texture, turgor normal. No rashes or lesions Lymph nodes: Cervical, supraclavicular, and axillary  nodes normal. No abnormal inguinal nodes palpated Neurologic: Grossly normal   Pelvic: External genitalia:  no lesions              Urethra:  normal appearing urethra with no masses, tenderness or lesions              Bartholin's and Skene's: normal                 Vagina: normal appearing vagina with normal color and discharge, no lesions              Cervix: anteverted              Pap taken: Yes.   Bimanual Exam:  Uterus:  normal size, contour, position, consistency, mobility, non-tender              Adnexa: no mass, fullness, tenderness               Rectovaginal: Confirms               Anus:  normal sphincter tone, no lesions  Chaperone present:  no  A:   Well Woman with normal exam  Postmenopausal no HRT  History of LGSIL 2011, colpo biopsy 11/2010  Vit D deficiency.  Small umbilical hernia.   Right breast mass - needs evaluation   P:   Reviewed health and wellness pertinent to exam  Pap smear taken today  Mammogram will be scheduled - diagnostic  Follow with labs and IFOB  Counseled on breast self exam, mammography screening, adequate intake of calcium and vitamin D, diet and exercise, Kegel's exercises return annually or prn  An After Visit Summary was printed and given to the patient.

## 2014-11-21 NOTE — Progress Notes (Signed)
Scheduled patient for 3D bilateral and bilateral diagnostic mammogram at the [facility] while in office today. Appointment scheduled for 11/28/2014 at 1:45pm. Agreeable to date and time.

## 2014-11-21 NOTE — Patient Instructions (Signed)

## 2014-11-22 LAB — VITAMIN D 25 HYDROXY (VIT D DEFICIENCY, FRACTURES): Vit D, 25-Hydroxy: 21 ng/mL — ABNORMAL LOW (ref 30–100)

## 2014-11-23 NOTE — Progress Notes (Signed)
Reviewed personally.  M. Suzanne Quinton Voth, MD.  

## 2014-11-25 ENCOUNTER — Other Ambulatory Visit: Payer: Self-pay

## 2014-11-25 ENCOUNTER — Other Ambulatory Visit: Payer: Self-pay | Admitting: Nurse Practitioner

## 2014-11-25 DIAGNOSIS — N6310 Unspecified lump in the right breast, unspecified quadrant: Secondary | ICD-10-CM

## 2014-11-25 LAB — IPS PAP TEST WITH HPV

## 2014-11-28 ENCOUNTER — Ambulatory Visit
Admission: RE | Admit: 2014-11-28 | Discharge: 2014-11-28 | Disposition: A | Discharge status: Home / Self Care | Payer: 59 | Source: Ambulatory Visit | Attending: Nurse Practitioner | Admitting: Nurse Practitioner

## 2014-11-28 DIAGNOSIS — N6310 Unspecified lump in the right breast, unspecified quadrant: Secondary | ICD-10-CM

## 2014-12-09 ENCOUNTER — Telehealth: Payer: Self-pay | Admitting: Emergency Medicine

## 2014-12-09 NOTE — Telephone Encounter (Signed)
-----   Message from [place], MD sent at 12/08/2014  1:35 PM EST ----- Please schedule an appointment for a breast recheck for about 4 weeks from now.   If mass is still present then, will need referral to general surgery.  Do not remove from mammogram hold.   Heil

## 2014-12-09 NOTE — Telephone Encounter (Signed)
Message left to return call to Hattie Aguinaldo at [phone].    

## 2014-12-10 NOTE — Telephone Encounter (Signed)
Patient scheduled for breast recheck 12/26/14 with Milford Cage, [place].   Routing to provider for final review. Patient agreeable to disposition. Will close encounter

## 2014-12-26 ENCOUNTER — Encounter: Payer: Self-pay | Admitting: Nurse Practitioner

## 2014-12-26 ENCOUNTER — Ambulatory Visit (INDEPENDENT_AMBULATORY_CARE_PROVIDER_SITE_OTHER): Payer: 59 | Admitting: Nurse Practitioner

## 2014-12-26 VITALS — BP 132/86 | HR 68 | Ht 64.0 in | Wt 158.0 lb

## 2014-12-26 DIAGNOSIS — N63 Unspecified lump in unspecified breast: Secondary | ICD-10-CM

## 2014-12-26 NOTE — Progress Notes (Signed)
Patient is scheduled for consult with Dr. Donne Hazel at [facility] for 01/30/15 at [place]. Patient is aware of appointment.

## 2014-12-26 NOTE — Progress Notes (Signed)
   Subjective:   54 y.o. Married Caucasian female [phone] presents for follow up evaluation of right breast mass. Onset of the symptoms was mid January.  When she came in for AEX on 11/21/14 she was found to have a prominent right breast area at 10 :00 position without definite borders.  She then went for a diagnostic Mammo and [place] on  11/28/14 which was normal fibrofatty and dense fibroglandular tissue. She comes in now for [place] to recheck this area of concern.  No other changes in health history, no change in her assessment of this area.  She is postmenopausal about 6-7 years.   No HRT.   No [place] of breast cancer.  Denies any trauma.   Review of Systems A comprehensive review of systems was negative.   Objective:   General appearance: alert, cooperative and appears stated age Head: Normocephalic, without obvious abnormality, atraumatic  Back: negative, symmetric, no curvature. ROM normal. No CVA tenderness. Lungs: clear to auscultation bilaterally Breasts: Bilateral breast with FCB chagnes, area is more prominent on the right at 10:00 position.  She is also examined by Dr. Quincy Simmonds.  She has no nodes and no nipple discharge. Heart: regular rate and rhythm Abdomen: normal findings: soft, non-tender    Assessment:   ASSESSMENT:Patient is diagnosed with FCB changes   Plan:   PLAN: The patient has a documented plan to follow with further care of surgical breast consult to see if any further plans need to be done. 2. PLAN: to see breast surgeon

## 2014-12-29 NOTE — Progress Notes (Signed)
Encounter reviewed by Dr. Cristiana Yochim Silva.  

## 2014-12-30 LAB — FECAL OCCULT BLOOD, IMMUNOCHEMICAL: IFOBT: NEGATIVE

## 2014-12-30 NOTE — Addendum Note (Signed)
Addended by: Robley Fries on: 12/30/2014 11:43 AM   Modules accepted: Orders

## 2015-11-26 ENCOUNTER — Telehealth: Payer: Self-pay | Admitting: Nurse Practitioner

## 2015-11-26 NOTE — Telephone Encounter (Signed)
LEFT MESSAGE FOR APPT/RD

## 2015-11-27 ENCOUNTER — Encounter: Payer: Self-pay | Admitting: Nurse Practitioner

## 2015-11-27 ENCOUNTER — Ambulatory Visit (INDEPENDENT_AMBULATORY_CARE_PROVIDER_SITE_OTHER): Payer: 59 | Admitting: Nurse Practitioner

## 2015-11-27 VITALS — BP 150/86 | HR 78 | Resp 18 | Ht 64.0 in | Wt 161.0 lb

## 2015-11-27 DIAGNOSIS — Z0000 Encounter for general adult medical examination without abnormal findings: Secondary | ICD-10-CM | POA: Diagnosis not present

## 2015-11-27 DIAGNOSIS — Z01419 Encounter for gynecological examination (general) (routine) without abnormal findings: Secondary | ICD-10-CM

## 2015-11-27 LAB — CBC WITH DIFFERENTIAL/PLATELET
BASOS ABS: 0 10*3/uL (ref 0.0–0.1)
BASOS PCT: 0 % (ref 0–1)
EOS ABS: 0.1 10*3/uL (ref 0.0–0.7)
Eosinophils Relative: 1 % (ref 0–5)
HCT: 41.3 % (ref 36.0–46.0)
Hemoglobin: 13.5 g/dL (ref 12.0–15.0)
Lymphocytes Relative: 38 % (ref 12–46)
Lymphs Abs: 3 10*3/uL (ref 0.7–4.0)
MCH: 27.8 pg (ref 26.0–34.0)
MCHC: 32.7 g/dL (ref 30.0–36.0)
MCV: 85.2 fL (ref 78.0–100.0)
MONOS PCT: 7 % (ref 3–12)
MPV: 10 fL (ref 8.6–12.4)
Monocytes Absolute: 0.6 10*3/uL (ref 0.1–1.0)
NEUTROS PCT: 54 % (ref 43–77)
Neutro Abs: 4.3 10*3/uL (ref 1.7–7.7)
PLATELETS: 272 10*3/uL (ref 150–400)
RBC: 4.85 MIL/uL (ref 3.87–5.11)
RDW: 14.2 % (ref 11.5–15.5)
WBC: 8 10*3/uL (ref 4.0–10.5)

## 2015-11-27 LAB — HEMOGLOBIN A1C
Hgb A1c MFr Bld: 5.7 % — ABNORMAL HIGH (ref <5.7)
MEAN PLASMA GLUCOSE: 117 mg/dL — AB (ref <117)

## 2015-11-27 NOTE — Progress Notes (Signed)
55 y.o. [id number] Married  Caucasian Fe here for annual exam.  Rare vaso symptoms. Some vaginal dryness.  New grand daughter.  Youngest daughter getting married in 05/2016.  Patient's last menstrual period was 01/24/2008 (approximate).          Sexually active: Yes.    The current method of family planning is post menopausal status.    Exercising: Yes.    Walking approx 3.5 miles 5 days a week. Smoker:  no  Health Maintenance: Pap:  11/21/14, negative with neg HR HPV; 12/15/10 ASCUS HPV Neg, Colpo CIN 1. History of positive HR HPV and ASCUS pap in 2011           MMG:  11/28/2014 Digital Diagnostic BiRads Category 1 Negative Colonoscopy:  11/25/11 tubular adenoma, repeat 5 years TDaP:  11/08/2013  Hep C and HIV: will do today Labs:   UA Not done today. Patient denies symptoms   reports that she has never smoked. She has never used smokeless tobacco. She reports that she drinks about 1.2 - 1.8 oz of alcohol per week. She reports that she does not use illicit drugs.  Past Medical History  Diagnosis Date  . Seasonal affective disorder ([place])   . Adenoma     Past Surgical History  Procedure Laterality Date  . Appendectomy    . Cholecystectomy    . Tonsillectomy    . Incisional hernia repair    . Oophorectomy Left 2002  . Dilation and curettage of uterus      Current Outpatient Prescriptions  Medication Sig Dispense Refill  . B Complex Vitamins (VITAMIN B COMPLEX PO) Take 1 capsule by mouth daily.    . Ibuprofen (ADVIL PO) Take 1 tablet by mouth as needed.    . Probiotic Product (PROBIOTIC DAILY PO) Take 1 capsule by mouth daily.    . Cholecalciferol (VITAMIN D) 2000 UNITS CAPS Take 4,000-6,000 Units by mouth daily. Reported on 11/27/2015     No current facility-administered medications for this visit.    Family History  Problem Relation Age of Onset  . Colon cancer Neg Hx   . Esophageal cancer Neg Hx   . Stomach cancer Neg Hx   . Rectal cancer Neg Hx   . Hypertension Mother   .  Osteoarthritis Mother   . Diabetes Father   . Hypertension Father     ROS:  Pertinent items are noted in HPI.  Otherwise, a comprehensive ROS was negative.  Exam:   BP 150/86 mmHg  Pulse 78  Resp 18  Ht 5\' 4"  (1.626 m)  Wt 161 lb (73.029 kg)  BMI 27.62 kg/m2  LMP 01/24/2008 (Approximate) Height: 5\' 4"  (162.6 cm) Ht Readings from Last 3 Encounters:  11/27/15 5\' 4"  (1.626 m)  12/26/14 5\' 4"  (1.626 m)  11/21/14 5\' 4"  (1.626 m)    General appearance: alert, cooperative and appears stated age Head: Normocephalic, without obvious abnormality, atraumatic Neck: no adenopathy, supple, symmetrical, trachea midline and thyroid normal to inspection and palpation Lungs: clear to auscultation bilaterally Breasts: normal appearance, no masses or tenderness Heart: regular rate and rhythm Abdomen: soft, non-tender; no masses,  no organomegaly Extremities: extremities normal, atraumatic, no cyanosis or edema Skin: Skin color, texture, turgor normal. No rashes or lesions Lymph nodes: Cervical, supraclavicular, and axillary nodes normal. No abnormal inguinal nodes palpated Neurologic: Grossly normal   Pelvic: External genitalia:  no lesions              Urethra:  normal appearing urethra with  no masses, tenderness or lesions              Bartholin's and Skene's: normal                 Vagina: normal appearing vagina with normal color and discharge, no lesions              Cervix: anteverted              Pap taken: No. Bimanual Exam:  Uterus:  normal size, contour, position, consistency, mobility, non-tender              Adnexa: no mass, fullness, tenderness               Rectovaginal: Confirms               Anus:  normal sphincter tone, no lesions  Chaperone present: yes  A:  Well Woman with normal exam  Postmenopausal no HRT History of LGSIL 2011, colpo biopsy 11/2010 Vit D deficiency.   P:   Reviewed health and wellness pertinent to exam  Pap smear  as above  Mammogram is due this month and will schedule  Will follow with labs  Counseled on breast self exam, mammography screening, adequate intake of calcium and vitamin D, diet and exercise return annually or prn  An After Visit Summary was printed and given to the patient.

## 2015-11-27 NOTE — Patient Instructions (Signed)

## 2015-11-28 LAB — COMPREHENSIVE METABOLIC PANEL
ALBUMIN: 4.5 g/dL (ref 3.6–5.1)
ALK PHOS: 64 U/L (ref 33–130)
ALT: 21 U/L (ref 6–29)
AST: 18 U/L (ref 10–35)
BILIRUBIN TOTAL: 0.8 mg/dL (ref 0.2–1.2)
BUN: 19 mg/dL (ref 7–25)
CALCIUM: 9.8 mg/dL (ref 8.6–10.4)
CO2: 20 mmol/L (ref 20–31)
Chloride: 104 mmol/L (ref 98–110)
Creat: 0.67 mg/dL (ref 0.50–1.05)
Glucose, Bld: 87 mg/dL (ref 65–99)
Potassium: 4.1 mmol/L (ref 3.5–5.3)
Sodium: 142 mmol/L (ref 135–146)
Total Protein: 7.2 g/dL (ref 6.1–8.1)

## 2015-11-28 LAB — LIPID PANEL
Cholesterol: 199 mg/dL (ref 125–200)
HDL: 61 mg/dL (ref >=46)
LDL CALC: 112 mg/dL (ref <130)
TRIGLYCERIDES: 128 mg/dL (ref <150)
Total CHOL/HDL Ratio: 3.3 Ratio (ref <=5.0)
VLDL: 26 mg/dL (ref <30)

## 2015-11-28 LAB — VITAMIN D 25 HYDROXY (VIT D DEFICIENCY, FRACTURES): Vit D, 25-Hydroxy: 21 ng/mL — ABNORMAL LOW (ref 30–100)

## 2015-11-28 LAB — HIV ANTIBODY (ROUTINE TESTING W REFLEX): HIV 1&2 Ab, 4th Generation: NONREACTIVE

## 2015-11-28 LAB — HEPATITIS C ANTIBODY: HCV Ab: NEGATIVE

## 2015-11-28 LAB — TSH: TSH: 2.04 u[IU]/mL (ref 0.350–4.500)

## 2015-11-30 LAB — HEMOGLOBIN, FINGERSTICK: Hemoglobin, fingerstick: 13.4 g/dL (ref 12.0–16.0)

## 2015-12-02 NOTE — Progress Notes (Signed)
Encounter reviewed by Dr. Brook Amundson C. Silva.  

## 2016-03-22 ENCOUNTER — Encounter: Payer: Self-pay | Admitting: *Deleted

## 2016-10-04 ENCOUNTER — Encounter: Payer: Self-pay | Admitting: *Deleted

## 2016-11-03 ENCOUNTER — Encounter: Payer: Self-pay | Admitting: Internal Medicine

## 2016-11-28 ENCOUNTER — Ambulatory Visit: Payer: 59 | Admitting: Nurse Practitioner

## 2018-07-17 DIAGNOSIS — H66003 Acute suppurative otitis media without spontaneous rupture of ear drum, bilateral: Secondary | ICD-10-CM | POA: Diagnosis not present

## 2018-07-17 DIAGNOSIS — R030 Elevated blood-pressure reading, without diagnosis of hypertension: Secondary | ICD-10-CM | POA: Diagnosis not present

## 2018-08-13 ENCOUNTER — Encounter: Payer: Self-pay | Admitting: Family Medicine

## 2018-08-13 ENCOUNTER — Ambulatory Visit (INDEPENDENT_AMBULATORY_CARE_PROVIDER_SITE_OTHER): Payer: 59 | Admitting: Family Medicine

## 2018-08-13 VITALS — BP 168/94 | HR 95 | Temp 98.0°F | Ht 64.0 in | Wt 166.6 lb

## 2018-08-13 DIAGNOSIS — Z23 Encounter for immunization: Secondary | ICD-10-CM | POA: Diagnosis not present

## 2018-08-13 DIAGNOSIS — E7800 Pure hypercholesterolemia, unspecified: Secondary | ICD-10-CM

## 2018-08-13 DIAGNOSIS — I10 Essential (primary) hypertension: Secondary | ICD-10-CM

## 2018-08-13 DIAGNOSIS — K635 Polyp of colon: Secondary | ICD-10-CM | POA: Diagnosis not present

## 2018-08-13 MED ORDER — LISINOPRIL 5 MG PO TABS: 5.0000 mg | ORAL_TABLET | Freq: Every day | ORAL | 2 refills | Status: DC

## 2018-08-13 NOTE — Progress Notes (Signed)
Kaitlyn Garcia DOB: 1960/11/06 Encounter date: 08/13/2018  This is a 57 y.o. female who presents to establish care. Chief Complaint  Patient presents with  . New Patient (Initial Visit)    flu shot?    History of present illness: [facility] and [facility]. Has fluctuated over last years with weight fluctuation which she feels is contributing factor. Has never been on medication before. Did buy digital home cuff and ranged from [id number] at home diastolic in 24'P. [facility] the [facility] 160's [facility]. Has done weight watchers in past to lose weight. More exercise when weight higher.   Had colon polyps and is due for repeat colonoscopy   Past Medical History:  Diagnosis Date  . Adenoma   . High cholesterol   . Hypertension   . Polyp, colonic   . Seasonal affective disorder [facility])    Past Surgical History:  Procedure Laterality Date  . APPENDECTOMY  2000  . CHOLECYSTECTOMY  2001  . DILATION AND CURETTAGE OF UTERUS    . [place]  2001  . OOPHORECTOMY Left 2002  . TONSILLECTOMY  1965   No Known Allergies Current Meds  Medication Sig  . Ibuprofen (ADVIL PO) Take 1 tablet by mouth as needed.  . Probiotic Product (PROBIOTIC DAILY PO) Take 1 capsule by mouth daily.   Social History   Tobacco Use  . Smoking status: Never Smoker  . Smokeless tobacco: Never Used  Substance Use Topics  . Alcohol use: Yes    Alcohol/week: 2.0 - 3.0 standard drinks    Types: 2 - 3 Glasses of wine per week    Comment: 3-4 glasses of wine a week   Family History  Problem Relation Age of Onset  . Hypertension Mother   . Osteoarthritis Mother   . Depression Mother   . High Cholesterol Mother   . Diabetes Father   . Hypertension Father   . Heart attack Father 57  . Heart disease Father   . High blood pressure Brother   . Arthritis Maternal Grandmother   . Stroke Maternal Grandfather   . High blood pressure Paternal Grandmother   . Hearing loss  Paternal Grandmother   . Hearing loss Paternal Grandfather   . Heart attack Paternal Grandfather   . Heart disease Paternal Grandfather   . Colon cancer Neg Hx   . Esophageal cancer Neg Hx   . Stomach cancer Neg Hx   . Rectal cancer Neg Hx      Review of Systems  Constitutional: Negative for chills, fatigue and fever.  Respiratory: Negative for cough, chest tightness, shortness of breath and wheezing.   Cardiovascular: Negative for chest pain, palpitations and leg swelling.  Neurological: Negative for dizziness, light-headedness and headaches.  Psychiatric/Behavioral: Negative for sleep disturbance.    Objective:  BP (!) 168/94   Pulse 95   Temp 98 F (36.7 C) (Oral)   Ht 5\' 4"  (1.626 m)   Wt 166 lb 9.6 oz (75.6 kg)   LMP 01/24/2008 (Approximate) Comment: spotting  SpO2 99%   BMI 28.60 kg/m   Weight: 166 lb 9.6 oz (75.6 kg)   BP Readings from Last 3 Encounters:  08/13/18 (!) 168/94  11/27/15 (!) 150/86  12/26/14 132/86   Wt Readings from Last 3 Encounters:  08/13/18 166 lb 9.6 oz (75.6 kg)  11/27/15 161 lb (73 kg)  12/26/14 158 lb (71.7 kg)    Physical Exam  Constitutional: She is oriented  to person, place, and time. She appears well-developed and well-nourished. No distress.  Cardiovascular: Normal rate, regular rhythm and normal heart sounds. Exam reveals no friction rub.  No murmur heard. No lower extremity edema  Pulmonary/Chest: Effort normal and breath sounds normal. No respiratory distress. She has no wheezes. She has no rales.  Neurological: She is alert and oriented to person, place, and time.  Psychiatric: Her behavior is normal. Cognition and memory are normal.    Assessment/Plan:  1. Hypertension, unspecified type Will start low dose lisinopril. Monitor at home and record. Bring cuff to office.    - Comprehensive metabolic panel; Future - CBC with Differential/Platelet; Future - lisinopril (PRINIVIL,ZESTRIL) 5 MG tablet; Take 1 tablet (5 mg  total) by mouth daily.  Dispense: 30 tablet; Refill: 2  2. Polyp of colon, unspecified part of colon, unspecified type - Ambulatory referral to Gastroenterology  3. High cholesterol - Lipid panel; Future - TSH; Future  Return in about 1 month (around 09/13/2018) for nurse visit: BP recheck in 2 weeks.  Micheline Rough, MD

## 2018-08-13 NOTE — Patient Instructions (Addendum)
Why is Exercise Important? If I told you I had a single pill that would help you decrease stress by improving anxiety, decreasing depression, help you achieve a healthy weight, give you more energy, make you more productive, help you focus, decrease your risk of dementia/heart attack/stroke/falls, improve your bone health, and more would you be interested? These are just some of the benefits that exercise brings to you. IT IS WORTH carving out some time every day to fit in exercise. It will help in every aspect of your health. Even if you have injuries that prevent you from participating in a type of exercise you used to do; there is always something that you can do to keep exercise a part of your life. If improving your health is important, make exercise your priority. It is worth the time! If you have questions about the type of exercise that is right for you, please talk with me about this!     Exercising to Stay Healthy  Exercising regularly is important. It has many health benefits, such as:  Improving your overall fitness, flexibility, and endurance.  Increasing your bone density.  Helping with weight control.  Decreasing your body fat.  Increasing your muscle strength.  Reducing stress and tension.  Improving your overall health.   In order to become healthy and stay healthy, it is recommended that you do moderate-intensity and vigorous-intensity exercise. You can tell that you are exercising at a moderate intensity if you have a higher heart rate and faster breathing, but you are still able to hold a conversation. You can tell that you are exercising at a vigorous intensity if you are breathing much harder and faster and cannot hold a conversation while exercising. How often should I exercise? Choose an activity that you enjoy and set realistic goals. Your health care provider can help you to make an activity plan that works for you. Exercise regularly as directed by your health care  provider. This may include:  Doing resistance training twice each week, such as: ? Push-ups. ? Sit-ups. ? Lifting weights. ? Using resistance bands.  Doing a given intensity of exercise for a given amount of time. Choose from these options: ? 150 minutes of moderate-intensity exercise every week. ? 75 minutes of vigorous-intensity exercise every week. ? A mix of moderate-intensity and vigorous-intensity exercise every week.   Children, pregnant women, people who are out of shape, people who are overweight, and older adults may need to consult a health care provider for individual recommendations. If you have any sort of medical condition, be sure to consult your health care provider before starting a new exercise program. What are some exercise ideas? Some moderate-intensity exercise ideas include:  Walking at a rate of 1 mile in 15 minutes.  Biking.  Hiking.  Golfing.  Dancing.   Some vigorous-intensity exercise ideas include:  Walking at a rate of at least 4.5 miles per hour.  Jogging or running at a rate of 5 miles per hour.  Biking at a rate of at least 10 miles per hour.  Lap swimming.  Roller-skating or in-line skating.  Cross-country skiing.  Vigorous competitive sports, such as football, basketball, and soccer.  Jumping rope.  Aerobic dancing.   What are some everyday activities that can help me to get exercise?  Yard work, such as: ? Pushing a lawn mower. ? Raking and bagging leaves.  Washing and waxing your car.  Pushing a stroller.  Shoveling snow.  Gardening.  Washing windows or   floors. How can I be more active in my day-to-day activities?  Use the stairs instead of the elevator.  Take a walk during your lunch break.  If you drive, park your car farther away from work or school.  If you take public transportation, get off one stop early and walk the rest of the way.  Make all of your phone calls while standing up and walking  around.  Get up, stretch, and walk around every 30 minutes throughout the day. What guidelines should I follow while exercising?  Do not exercise so much that you hurt yourself, feel dizzy, or get very short of breath.  Consult your health care provider before starting a new exercise program.  Wear comfortable clothes and shoes with good support.  Drink plenty of water while you exercise to prevent dehydration or heat stroke. Body water is lost during exercise and must be replaced.  Work out until you breathe faster and your heart beats faster. This information is not intended to replace advice given to you by your health care provider. Make sure you discuss any questions you have with your health care provider.    DASH Eating Plan DASH stands for "Dietary Approaches to Stop Hypertension." The DASH eating plan is a healthy eating plan that has been shown to reduce high blood pressure (hypertension). It may also reduce your risk for type 2 diabetes, heart disease, and stroke. The DASH eating plan may also help with weight loss. What are tips for following this plan? General guidelines  Avoid eating more than 2,300 mg (milligrams) of salt (sodium) a day. If you have hypertension, you may need to reduce your sodium intake to 1,500 mg a day.  Limit alcohol intake to no more than 1 drink a day for nonpregnant women and 2 drinks a day for men. One drink equals 12 oz of beer, 5 oz of wine, or 1 oz of hard liquor.  Work with your health care provider to maintain a healthy body weight or to lose weight. Ask what an ideal weight is for you.  Get at least 30 minutes of exercise that causes your heart to beat faster (aerobic exercise) most days of the week. Activities may include walking, swimming, or biking.  Work with your health care provider or diet and nutrition specialist (dietitian) to adjust your eating plan to your individual calorie needs. Reading food labels  Check food labels for  the amount of sodium per serving. Choose foods with less than 5 percent of the Daily Value of sodium. Generally, foods with less than 300 mg of sodium per serving fit into this eating plan.  To find whole grains, look for the word "whole" as the first word in the ingredient list. Shopping  Buy products labeled as "low-sodium" or "no salt added."  Buy fresh foods. Avoid canned foods and premade or frozen meals. Cooking  Avoid adding salt when cooking. Use salt-free seasonings or herbs instead of table salt or sea salt. Check with your health care provider or pharmacist before using salt substitutes.  Do not fry foods. Cook foods using healthy methods such as baking, boiling, grilling, and broiling instead.  Cook with heart-healthy oils, such as olive, canola, soybean, or sunflower oil. Meal planning   Eat a balanced diet that includes: ? 5 or more servings of fruits and vegetables each day. At each meal, try to fill half of your plate with fruits and vegetables. ? Up to 6-8 servings of whole grains each day. ?  Less than 6 oz of lean meat, poultry, or fish each day. A 3-oz serving of meat is about the same size as a deck of cards. One egg equals 1 oz. ? 2 servings of low-fat dairy each day. ? A serving of nuts, seeds, or beans 5 times each week. ? Heart-healthy fats. Healthy fats called Omega-3 fatty acids are found in foods such as flaxseeds and coldwater fish, like sardines, salmon, and mackerel.  Limit how much you eat of the following: ? Canned or prepackaged foods. ? Food that is high in trans fat, such as fried foods. ? Food that is high in saturated fat, such as fatty meat. ? Sweets, desserts, sugary drinks, and other foods with added sugar. ? Full-fat dairy products.  Do not salt foods before eating.  Try to eat at least 2 vegetarian meals each week.  Eat more home-cooked food and less restaurant, buffet, and fast food.  When eating at a restaurant, ask that your food be  prepared with less salt or no salt, if possible. What foods are recommended? The items listed may not be a complete list. Talk with your dietitian about what dietary choices are best for you. Grains Whole-grain or whole-wheat bread. Whole-grain or whole-wheat pasta. Brown rice. [place]. Bulgur. Whole-grain and low-sodium cereals. Pita bread. Low-fat, low-sodium crackers. Whole-wheat flour tortillas. Vegetables Fresh or frozen vegetables (raw, steamed, roasted, or grilled). Low-sodium or reduced-sodium tomato and vegetable juice. Low-sodium or reduced-sodium tomato sauce and tomato paste. Low-sodium or reduced-sodium canned vegetables. Fruits All fresh, dried, or frozen fruit. Canned fruit in natural juice (without added sugar). Meat and other protein foods Skinless chicken or [place]. Ground chicken or [place]. Pork with fat trimmed off. Fish and seafood. Egg whites. Dried beans, peas, or lentils. Unsalted nuts, nut butters, and seeds. Unsalted canned beans. Lean cuts of beef with fat trimmed off. Low-sodium, lean deli meat. Dairy Low-fat (1%) or fat-free (skim) milk. Fat-free, low-fat, or reduced-fat cheeses. Nonfat, low-sodium ricotta or cottage cheese. Low-fat or nonfat yogurt. Low-fat, low-sodium cheese. Fats and oils Soft margarine without trans fats. Vegetable oil. Low-fat, reduced-fat, or light mayonnaise and salad dressings (reduced-sodium). Canola, safflower, olive, soybean, and sunflower oils. Avocado. Seasoning and other foods Herbs. Spices. Seasoning mixes without salt. Unsalted popcorn and pretzels. Fat-free sweets. What foods are not recommended? The items listed may not be a complete list. Talk with your dietitian about what dietary choices are best for you. Grains Baked goods made with fat, such as croissants, muffins, or some breads. Dry pasta or rice meal packs. Vegetables Creamed or fried vegetables. Vegetables in a cheese sauce. Regular canned vegetables (not  low-sodium or reduced-sodium). Regular canned tomato sauce and paste (not low-sodium or reduced-sodium). Regular tomato and vegetable juice (not low-sodium or reduced-sodium). Angie Fava. Olives. Fruits Canned fruit in a light or heavy syrup. Fried fruit. Fruit in cream or butter sauce. Meat and other protein foods Fatty cuts of meat. Ribs. Fried meat. Berniece Salines. Sausage. Bologna and other processed lunch meats. Salami. Fatback. Hotdogs. Bratwurst. Salted nuts and seeds. Canned beans with added salt. Canned or smoked fish. Whole eggs or egg yolks. Chicken or [place] with skin. Dairy Whole or 2% milk, cream, and half-and-half. Whole or full-fat cream cheese. Whole-fat or sweetened yogurt. Full-fat cheese. Nondairy creamers. Whipped toppings. Processed cheese and cheese spreads. Fats and oils Butter. Stick margarine. Lard. Shortening. Ghee. Bacon fat. Tropical oils, such as coconut, palm kernel, or palm oil. Seasoning and other foods Salted popcorn and pretzels. Onion salt,  garlic salt, seasoned salt, table salt, and sea salt. Worcestershire sauce. Tartar sauce. Barbecue sauce. Teriyaki sauce. Soy sauce, including reduced-sodium. Steak sauce. Canned and packaged gravies. Fish sauce. Oyster sauce. Cocktail sauce. Horseradish that you find on the shelf. Ketchup. Mustard. Meat flavorings and tenderizers. Bouillon cubes. Hot sauce and Tabasco sauce. Premade or packaged marinades. Premade or packaged taco seasonings. Relishes. Regular salad dressings. Where to find more information:  National Heart, Lung, and [place]: https://wilson-eaton.com/  American Heart Association: www.heart.org Summary  The DASH eating plan is a healthy eating plan that has been shown to reduce high blood pressure (hypertension). It may also reduce your risk for type 2 diabetes, heart disease, and stroke.  With the DASH eating plan, you should limit salt (sodium) intake to 2,300 mg a day. If you have hypertension, you may need to reduce  your sodium intake to 1,500 mg a day.  When on the DASH eating plan, aim to eat more fresh fruits and vegetables, whole grains, lean proteins, low-fat dairy, and heart-healthy fats.  Work with your health care provider or diet and nutrition specialist (dietitian) to adjust your eating plan to your individual calorie needs. This information is not intended to replace advice given to you by your health care provider. Make sure you discuss any questions you have with your health care provider. Document Released: 09/29/2011 Document Revised: 10/03/2016 Document Reviewed: 10/03/2016 Elsevier Interactive Patient Education  [employer].

## 2018-08-21 ENCOUNTER — Other Ambulatory Visit: Payer: 59

## 2018-08-27 ENCOUNTER — Other Ambulatory Visit (INDEPENDENT_AMBULATORY_CARE_PROVIDER_SITE_OTHER): Payer: 59

## 2018-08-27 DIAGNOSIS — E7800 Pure hypercholesterolemia, unspecified: Secondary | ICD-10-CM | POA: Diagnosis not present

## 2018-08-27 DIAGNOSIS — I10 Essential (primary) hypertension: Secondary | ICD-10-CM

## 2018-08-27 LAB — CBC WITH DIFFERENTIAL/PLATELET
Basophils Absolute: 0 10*3/uL (ref 0.0–0.1)
Basophils Relative: 0.6 % (ref 0.0–3.0)
EOS PCT: 2.8 % (ref 0.0–5.0)
Eosinophils Absolute: 0.2 10*3/uL (ref 0.0–0.7)
HCT: 39.6 % (ref 36.0–46.0)
Hemoglobin: 13.4 g/dL (ref 12.0–15.0)
Lymphocytes Relative: 44.2 % (ref 12.0–46.0)
Lymphs Abs: 2.7 10*3/uL (ref 0.7–4.0)
MCHC: 33.9 g/dL (ref 30.0–36.0)
MCV: 84 fl (ref 78.0–100.0)
MONOS PCT: 7.7 % (ref 3.0–12.0)
Monocytes Absolute: 0.5 10*3/uL (ref 0.1–1.0)
NEUTROS ABS: 2.7 10*3/uL (ref 1.4–7.7)
Neutrophils Relative %: 44.7 % (ref 43.0–77.0)
PLATELETS: 255 10*3/uL (ref 150.0–400.0)
RBC: 4.72 Mil/uL (ref 3.87–5.11)
RDW: 13.9 % (ref 11.5–15.5)
WBC: 6.1 10*3/uL (ref 4.0–10.5)

## 2018-08-27 LAB — TSH: TSH: 3.64 u[IU]/mL (ref 0.35–4.50)

## 2018-08-27 LAB — COMPREHENSIVE METABOLIC PANEL
ALBUMIN: 4.4 g/dL (ref 3.5–5.2)
ALT: 32 U/L (ref 0–35)
AST: 22 U/L (ref 0–37)
Alkaline Phosphatase: 71 U/L (ref 39–117)
BUN: 17 mg/dL (ref 6–23)
CALCIUM: 9.5 mg/dL (ref 8.4–10.5)
CHLORIDE: 105 meq/L (ref 96–112)
CO2: 26 meq/L (ref 19–32)
CREATININE: 0.8 mg/dL (ref 0.40–1.20)
GFR: 77.87 mL/min (ref >60.00)
Glucose, Bld: 105 mg/dL — ABNORMAL HIGH (ref 70–99)
POTASSIUM: 3.8 meq/L (ref 3.5–5.1)
Sodium: 140 mEq/L (ref 135–145)
Total Bilirubin: 1 mg/dL (ref 0.2–1.2)
Total Protein: 6.8 g/dL (ref 6.0–8.3)

## 2018-08-27 LAB — LIPID PANEL
Cholesterol: 203 mg/dL — ABNORMAL HIGH (ref 0–200)
HDL: 54.2 mg/dL (ref >39.00)
LDL Cholesterol: 128 mg/dL — ABNORMAL HIGH (ref 0–99)
NonHDL: 148.53
TRIGLYCERIDES: 103 mg/dL (ref 0.0–149.0)
Total CHOL/HDL Ratio: 4
VLDL: 20.6 mg/dL (ref 0.0–40.0)

## 2018-08-29 DIAGNOSIS — R350 Frequency of micturition: Secondary | ICD-10-CM | POA: Diagnosis not present

## 2018-08-29 DIAGNOSIS — N3001 Acute cystitis with hematuria: Secondary | ICD-10-CM | POA: Diagnosis not present

## 2018-09-14 ENCOUNTER — Encounter: Payer: Self-pay | Admitting: Family Medicine

## 2018-09-14 ENCOUNTER — Encounter: Payer: Self-pay | Admitting: Internal Medicine

## 2018-09-14 ENCOUNTER — Ambulatory Visit (INDEPENDENT_AMBULATORY_CARE_PROVIDER_SITE_OTHER): Payer: 59 | Admitting: Family Medicine

## 2018-09-14 VITALS — BP 128/88 | HR 84 | Temp 97.6°F | Wt 164.7 lb

## 2018-09-14 DIAGNOSIS — I10 Essential (primary) hypertension: Secondary | ICD-10-CM

## 2018-09-14 DIAGNOSIS — R7301 Impaired fasting glucose: Secondary | ICD-10-CM | POA: Diagnosis not present

## 2018-09-14 MED ORDER — LISINOPRIL 5 MG PO TABS: 5.0000 mg | ORAL_TABLET | Freq: Every day | ORAL | 1 refills | Status: DC

## 2018-09-14 NOTE — Patient Instructions (Signed)
If you lose weight and bp is running 110 or lower most checks, let me know.   If running 140+ let me know.

## 2018-09-14 NOTE — Progress Notes (Signed)
  Kaitlyn Garcia DOB: 1960-11-15 Encounter date: 09/14/2018  This is a 57 y.o. female who presents with Chief Complaint  Patient presents with  . Follow-up    pt has bp readings from, tolerating medication well    History of present illness:  Last visit 10/21 and started on lisinopril 5mg  daily for elevated blood pressure.  Has been doing well with lisinopril. BP at home have been 118-138 range; more in the 120's. Diastolic 07-62 more in the 80's range. No problems with medication.     No Known Allergies Current Meds  Medication Sig  . Ibuprofen (ADVIL PO) Take 1 tablet by mouth as needed.  Marland Kitchen lisinopril (PRINIVIL,ZESTRIL) 5 MG tablet Take 1 tablet (5 mg total) by mouth daily.  . Probiotic Product (PROBIOTIC DAILY PO) Take 1 capsule by mouth daily.  . [DISCONTINUED] lisinopril (PRINIVIL,ZESTRIL) 5 MG tablet Take 1 tablet (5 mg total) by mouth daily.    Review of Systems  Constitutional: Negative for chills, fatigue and fever.  Respiratory: Negative for cough, chest tightness, shortness of breath and wheezing.   Cardiovascular: Negative for chest pain, palpitations and leg swelling.    Objective:  BP 128/88   Pulse 84   Temp 97.6 F (36.4 C) (Oral)   Wt 164 lb 11.2 oz (74.7 kg)   LMP 01/24/2008 (Approximate) Comment: spotting  SpO2 97%   BMI 28.27 kg/m   Weight: 164 lb 11.2 oz (74.7 kg)   BP Readings from Last 3 Encounters:  09/14/18 128/88  08/13/18 (!) 168/94  11/27/15 (!) 150/86   Wt Readings from Last 3 Encounters:  09/14/18 164 lb 11.2 oz (74.7 kg)  08/13/18 166 lb 9.6 oz (75.6 kg)  11/27/15 161 lb (73 kg)    Physical Exam  Constitutional: She is oriented to person, place, and time. She appears well-developed and well-nourished. No distress.  Cardiovascular: Normal rate, regular rhythm and normal heart sounds. Exam reveals no friction rub.  No murmur heard. No lower extremity edema  Pulmonary/Chest: Effort normal and breath sounds normal. No respiratory  distress. She has no wheezes. She has no rales.  Neurological: She is alert and oriented to person, place, and time.  Psychiatric: Her behavior is normal. Cognition and memory are normal.    Assessment/Plan  1. Hypertension, unspecified type Continue lisinopril and please recheck bloodwork just prior to next visit.  2. Elevated fasting blood sugar.   See above   Return in about 6 months (around 03/15/2019) for physical exam.     Micheline Rough, MD

## 2018-09-24 ENCOUNTER — Encounter: Payer: Self-pay | Admitting: Obstetrics and Gynecology

## 2018-09-24 ENCOUNTER — Other Ambulatory Visit: Payer: Self-pay

## 2018-09-24 ENCOUNTER — Other Ambulatory Visit (HOSPITAL_COMMUNITY)
Admission: RE | Admit: 2018-09-24 | Discharge: 2018-09-24 | Disposition: A | Discharge status: Home / Self Care | Payer: 59 | Source: Ambulatory Visit | Attending: Obstetrics and Gynecology | Admitting: Obstetrics and Gynecology

## 2018-09-24 ENCOUNTER — Ambulatory Visit (INDEPENDENT_AMBULATORY_CARE_PROVIDER_SITE_OTHER): Payer: 59 | Admitting: Obstetrics and Gynecology

## 2018-09-24 VITALS — BP 164/100 | HR 80 | Resp 12 | Ht 63.5 in | Wt 164.2 lb

## 2018-09-24 DIAGNOSIS — Z01419 Encounter for gynecological examination (general) (routine) without abnormal findings: Secondary | ICD-10-CM | POA: Diagnosis present

## 2018-09-24 NOTE — Patient Instructions (Signed)

## 2018-09-24 NOTE — Progress Notes (Signed)
57 y.o. [id number] Married Caucasian female here for annual exam.    Not happy with her weight.  Has a new PCP.  Now on antiHTN.  Did Weight Watchers in the past.   24 yo grand daughter.  Now has stopped teaching.  Doing care giving.   Labs with PCP.   PCP:   Julianne Handler, MD   Patient's last menstrual period was 01/24/2008 (approximate).           Sexually active: Yes.    The current method of family planning is post menopausal status.    Exercising: Yes.    walking Smoker:  no  Health Maintenance: Pap:  11-21-14 negative, HR HPV negative            11-08-13 negative, HR HPV negative  History of abnormal Pap:  Yes.  12/15/10 ASCUS HPV Neg, Colpo CIN 1. History of positive HR HPV and ASCUS pap in 2011      MMG:  11-28-14 right breast U/S density C/BIRADS 1 negative. Colonoscopy:  11-25-11 tubular adenoma, repeat 5 years, scheduled 10-11-18 BMD:   n/a  Result  n/a TDaP:  11-08-13  Gardasil:   n/a HIV: 11-27-15 negative  Hep C: 11-27-15 negative  Screening Labs:  Hb today: PCP, Urine today: not collected Flu vaccine - done   reports that she has never smoked. She has never used smokeless tobacco. She reports that she drinks about 2.0 - 3.0 standard drinks of alcohol per week. She reports that she does not use drugs.  Past Medical History:  Diagnosis Date  . Adenoma   . High cholesterol   . Hypertension   . Polyp, colonic   . Seasonal affective disorder [facility])     Past Surgical History:  Procedure Laterality Date  . APPENDECTOMY  2000  . CHOLECYSTECTOMY  2001  . DILATION AND CURETTAGE OF UTERUS    . [place]  2001  . OOPHORECTOMY Left 2002  . TONSILLECTOMY  1965    Current Outpatient Medications  Medication Sig Dispense Refill  . Ibuprofen (ADVIL PO) Take 1 tablet by mouth as needed.    Marland Kitchen lisinopril (PRINIVIL,ZESTRIL) 5 MG tablet Take 1 tablet (5 mg total) by mouth daily. 90 tablet 1  . Probiotic Product (PROBIOTIC DAILY PO) Take 1 capsule by mouth  daily.     No current facility-administered medications for this visit.     Family History  Problem Relation Age of Onset  . Hypertension Mother   . Osteoarthritis Mother   . Depression Mother   . High Cholesterol Mother   . Diabetes Father   . Hypertension Father   . Heart attack Father 106  . Heart disease Father   . High blood pressure Brother   . Arthritis Maternal Grandmother   . Stroke Maternal Grandfather   . High blood pressure Paternal Grandmother   . Hearing loss Paternal Grandmother   . Hearing loss Paternal Grandfather   . Heart attack Paternal Grandfather   . Heart disease Paternal Grandfather   . Colon cancer Neg Hx   . Esophageal cancer Neg Hx   . Stomach cancer Neg Hx   . Rectal cancer Neg Hx     Review of Systems  Constitutional: Negative.   HENT: Negative.   Eyes: Negative.   Respiratory: Negative.   Cardiovascular: Negative.   Gastrointestinal: Negative.   Endocrine: Negative.   Genitourinary: Negative.   Musculoskeletal: Negative.   Skin: Negative.   Allergic/Immunologic: Negative.   Neurological: Negative.  Hematological: Negative.   Psychiatric/Behavioral: Negative.     Exam:   BP (!) 164/100 (BP Location: Right Arm, Patient Position: Sitting, Cuff Size: Normal)   Pulse 80   Resp 12   Ht 5' 3.5" (1.613 m)   Wt 164 lb 4 oz (74.5 kg)   LMP 01/24/2008 (Approximate) Comment: spotting  BMI 28.64 kg/m     General appearance: alert, cooperative and appears stated age Head: Normocephalic, without obvious abnormality, atraumatic Neck: no adenopathy, supple, symmetrical, trachea midline and thyroid normal to inspection and palpation Lungs: clear to auscultation bilaterally Breasts: normal appearance, no masses or tenderness, No nipple retraction or dimpling, No nipple discharge or bleeding, No axillary or supraclavicular adenopathy Heart: regular rate and rhythm Abdomen: soft, non-tender; umbilical hernia noted - nontender, no  organomegaly Extremities: extremities normal, atraumatic, no cyanosis or edema Skin: Skin color, texture, turgor normal. No rashes or lesions Lymph nodes: Cervical, supraclavicular, and axillary nodes normal. No abnormal inguinal nodes palpated Neurologic: Grossly normal  Pelvic: External genitalia:  no lesions              Urethra:  normal appearing urethra with no masses, tenderness or lesions              Bartholins and Skenes: normal                 Vagina: normal appearing vagina with normal color and discharge, no lesions              Cervix: no lesions              Pap taken: Yes.   Bimanual Exam:  Uterus:  normal size, contour, position, consistency, mobility, non-tender              Adnexa: no mass, fullness, tenderness              Rectal exam: Yes.  .  Confirms.              Anus:  normal sphincter tone, no lesions  Chaperone was present for exam.  Assessment:   Well woman visit with normal exam. Hx LGSIL. Umbilical hernia.   Plan: Mammogram screening.  She will schedule.  Recommended self breast awareness. Pap and HR HPV as above. Guidelines for Calcium, Vitamin D, regular exercise program including cardiovascular and weight bearing exercise. Labs with PCP.  She will consider consultation with a general surgeon.  Follow up annually and prn.   After visit summary provided.

## 2018-09-27 LAB — CYTOLOGY - PAP
Diagnosis: NEGATIVE
HPV (WINDOPATH): NOT DETECTED

## 2018-10-01 ENCOUNTER — Ambulatory Visit (AMBULATORY_SURGERY_CENTER): Payer: Self-pay

## 2018-10-01 ENCOUNTER — Encounter: Payer: Self-pay | Admitting: Internal Medicine

## 2018-10-01 VITALS — Ht 64.0 in | Wt 163.8 lb

## 2018-10-01 DIAGNOSIS — Z86010 Personal history of colonic polyps: Secondary | ICD-10-CM

## 2018-10-01 MED ORDER — NA SULFATE-K SULFATE-MG SULF 17.5-3.13-1.6 GM/177ML PO SOLN: 1.0000 | Freq: Once | ORAL | 0 refills | Status: AC

## 2018-10-01 NOTE — Progress Notes (Signed)
No egg or soy allergy known to patient  No issues with past sedation with any surgeries  or procedures, no intubation problems  No diet pills per patient No home 02 use per patient  No blood thinners per patient  Pt denies issues with constipation  No A fib or A flutter  EMMI video sent to pt's e mail. Pt said yes

## 2018-10-11 ENCOUNTER — Ambulatory Visit (AMBULATORY_SURGERY_CENTER): Payer: 59 | Admitting: Internal Medicine

## 2018-10-11 ENCOUNTER — Encounter: Payer: Self-pay | Admitting: Internal Medicine

## 2018-10-11 VITALS — BP 143/89 | HR 71 | Temp 98.7°F | Resp 15 | Ht 64.0 in | Wt 163.0 lb

## 2018-10-11 DIAGNOSIS — Z86010 Personal history of colonic polyps: Secondary | ICD-10-CM | POA: Diagnosis not present

## 2018-10-11 DIAGNOSIS — D120 Benign neoplasm of cecum: Secondary | ICD-10-CM | POA: Diagnosis not present

## 2018-10-11 DIAGNOSIS — D122 Benign neoplasm of ascending colon: Secondary | ICD-10-CM | POA: Diagnosis not present

## 2018-10-11 DIAGNOSIS — D123 Benign neoplasm of transverse colon: Secondary | ICD-10-CM

## 2018-10-11 DIAGNOSIS — D125 Benign neoplasm of sigmoid colon: Secondary | ICD-10-CM

## 2018-10-11 DIAGNOSIS — Z1211 Encounter for screening for malignant neoplasm of colon: Secondary | ICD-10-CM | POA: Diagnosis not present

## 2018-10-11 DIAGNOSIS — K635 Polyp of colon: Secondary | ICD-10-CM | POA: Diagnosis not present

## 2018-10-11 MED ORDER — SODIUM CHLORIDE 0.9 % IV SOLN: 500.0000 mL | Freq: Once | INTRAVENOUS | Status: DC

## 2018-10-11 NOTE — Progress Notes (Signed)
Called to room to assist during endoscopic procedure.  Patient ID and intended procedure confirmed with present staff. Received instructions for my participation in the procedure from the performing physician.  

## 2018-10-11 NOTE — Progress Notes (Signed)
Pt's states no medical or surgical changes since previsit or office visit. 

## 2018-10-11 NOTE — Op Note (Signed)
[facility] Patient Name: Kaitlyn Garcia Procedure Date: 10/11/2018 4:05 PM MRN: [id number] Endoscopist: Jerene Bears , MD Age: 57 Referring MD:  Date of Birth: 06-01-1961 Gender: Female Account #: [id number] Procedure:                Colonoscopy Indications:              High risk colon cancer surveillance: Personal                            history of non-advanced adenoma, Last colonoscopy:                            February 2013 Medicines:                Monitored Anesthesia Care Procedure:                Pre-Anesthesia Assessment:                           - Prior to the procedure, a History and Physical                            was performed, and patient medications and                            allergies were reviewed. The patient's tolerance of                            previous anesthesia was also reviewed. The risks                            and benefits of the procedure and the sedation                            options and risks were discussed with the patient.                            All questions were answered, and informed consent                            was obtained. Prior Anticoagulants: The patient has                            taken no previous anticoagulant or antiplatelet                            agents. ASA Grade Assessment: II - A patient with                            mild systemic disease. After reviewing the risks                            and benefits, the patient was deemed in  satisfactory condition to undergo the procedure.                           After obtaining informed consent, the colonoscope                            was passed under direct vision. Throughout the                            procedure, the patient's blood pressure, pulse, and                            oxygen saturations were monitored continuously. The                            Model PCF-[id number]) scope was introduced                             through the anus and advanced to the cecum,                            identified by appendiceal orifice and ileocecal                            valve. The colonoscopy was performed without                            difficulty. The patient tolerated the procedure                            well. The quality of the bowel preparation was                            good. The ileocecal valve, appendiceal orifice, and                            rectum were photographed. Scope In: 4:12:21 PM Scope Out: 4:33:38 PM Scope Withdrawal Time: 0 hours 16 minutes 28 seconds  Total Procedure Duration: 0 hours 21 minutes 17 seconds  Findings:                 The digital rectal exam was normal.                           A 5 mm polyp was found in the cecum. The polyp was                            sessile. The polyp was removed with a cold snare.                            Resection and retrieval were complete.                           A 5 mm polyp was found in the ascending colon. The  polyp was sessile. The polyp was removed with a                            cold snare. Resection and retrieval were complete.                           A 6 mm polyp was found in the hepatic flexure. The                            polyp was sessile. The polyp was removed with a                            cold snare. Resection and retrieval were complete.                           Two sessile polyps were found in the transverse                            colon. The polyps were 3 to 4 mm in size. These                            polyps were removed with a cold snare. Resection                            and retrieval were complete.                           A 5 mm polyp was found in the sigmoid colon. The                            polyp was sessile. The polyp was removed with a                            cold snare. Resection and retrieval were complete.                            Multiple small-mouthed diverticula were found in                            the sigmoid colon, descending colon, splenic                            flexure and hepatic flexure.                           Internal hemorrhoids were found during                            retroflexion. The hemorrhoids were small. Complications:            No immediate complications. Estimated Blood Loss:     Estimated blood loss was minimal. Impression:               - One 5  mm polyp in the cecum, removed with a cold                            snare. Resected and retrieved.                           - One 5 mm polyp in the ascending colon, removed                            with a cold snare. Resected and retrieved.                           - One 6 mm polyp at the hepatic flexure, removed                            with a cold snare. Resected and retrieved.                           - Two 3 to 4 mm polyps in the transverse colon,                            removed with a cold snare. Resected and retrieved.                           - One 5 mm polyp in the sigmoid colon, removed with                            a cold snare. Resected and retrieved.                           - Diverticulosis in the sigmoid colon, in the                            descending colon, at the splenic flexure and at the                            hepatic flexure.                           - Small internal hemorrhoids. Recommendation:           - Patient has a contact number available for                            emergencies. The signs and symptoms of potential                            delayed complications were discussed with the                            patient. Return to normal activities tomorrow.                            Written discharge instructions  were provided to the                            patient.                           - Resume previous diet.                           - Continue present  medications.                           - Await pathology results.                           - Repeat colonoscopy is recommended for                            surveillance. The colonoscopy date will be                            determined after pathology results from today's                            exam become available for review. Jerene Bears, MD 10/11/2018 4:38:14 PM This report has been signed electronically.

## 2018-10-11 NOTE — Patient Instructions (Signed)
YOU HAD AN ENDOSCOPIC PROCEDURE TODAY AT THE [facility]:   Refer to the procedure report that was given to you for any specific questions about what was found during the examination.  If the procedure report does not answer your questions, please call your gastroenterologist to clarify.  If you requested that your care partner not be given the details of your procedure findings, then the procedure report has been included in a sealed envelope for you to review at your convenience later.  YOU SHOULD EXPECT: Some feelings of bloating in the abdomen. Passage of more gas than usual.  Walking can help get rid of the air that was put into your GI tract during the procedure and reduce the bloating. If you had a lower endoscopy (such as a colonoscopy or flexible sigmoidoscopy) you may notice spotting of blood in your stool or on the toilet paper. If you underwent a bowel prep for your procedure, you may not have a normal bowel movement for a few days.  Please Note:  You might notice some irritation and congestion in your nose or some drainage.  This is from the oxygen used during your procedure.  There is no need for concern and it should clear up in a day or so.  SYMPTOMS TO REPORT IMMEDIATELY:   Following lower endoscopy (colonoscopy or flexible sigmoidoscopy):  Excessive amounts of blood in the stool  Significant tenderness or worsening of abdominal pains  Swelling of the abdomen that is new, acute  Fever of 100F or higher  For urgent or emergent issues, a gastroenterologist can be reached at any hour by calling [phone].   DIET:  We do recommend a small meal at first, but then you may proceed to your regular diet.  Drink plenty of fluids but you should avoid alcoholic beverages for 24 hours.  ACTIVITY:  You should plan to take it easy for the rest of today and you should NOT DRIVE or use heavy machinery until tomorrow (because of the sedation medicines used during the test).     FOLLOW UP: Our staff will call the number listed on your records the next business day following your procedure to check on you and address any questions or concerns that you may have regarding the information given to you following your procedure. If we do not reach you, we will leave a message.  However, if you are feeling well and you are not experiencing any problems, there is no need to return our call.  We will assume that you have returned to your regular daily activities without incident.  If any biopsies were taken you will be contacted by phone or by letter within the next 1-3 weeks.  Please call us at [phone] if you have not heard about the biopsies in 3 weeks.   Await for biopsy results Polyps (handout given) Hemorrhoids (handout given) Diverticulosis (handout given)  SIGNATURES/CONFIDENTIALITY: You and/or your care partner have signed paperwork which will be entered into your electronic medical record.  These signatures attest to the fact that that the information above on your After Visit Summary has been reviewed and is understood.  Full responsibility of the confidentiality of this discharge information lies with you and/or your care-partner. 

## 2018-10-11 NOTE — Progress Notes (Signed)
A and O x3. Report to RN. Tolerated MAC anesthesia well.

## 2018-10-12 ENCOUNTER — Telehealth: Payer: Self-pay

## 2018-10-12 NOTE — Telephone Encounter (Signed)
  Follow up Call-  Call back number 10/11/2018  Post procedure Call Back phone  # [phone]  Permission to leave phone message Yes  Some recent data might be hidden     Patient questions:  Do you have a fever, pain , or abdominal swelling? No. Pain Score  0 *  Have you tolerated food without any problems? Yes.    Have you been able to return to your normal activities? Yes.    Do you have any questions about your discharge instructions: Diet   No. Medications  No. Follow up visit  No.  Do you have questions or concerns about your Care? No.  Actions: * If pain score is 4 or above: No action needed, pain <4.

## 2018-10-18 ENCOUNTER — Encounter: Payer: Self-pay | Admitting: Internal Medicine

## 2018-10-28 NOTE — Progress Notes (Signed)
Please put repeat colonoscopy due in 5 years in HM. thanks

## 2018-12-06 DIAGNOSIS — H5203 Hypermetropia, bilateral: Secondary | ICD-10-CM | POA: Diagnosis not present

## 2018-12-06 DIAGNOSIS — H524 Presbyopia: Secondary | ICD-10-CM | POA: Diagnosis not present

## 2018-12-06 DIAGNOSIS — H04123 Dry eye syndrome of bilateral lacrimal glands: Secondary | ICD-10-CM | POA: Diagnosis not present

## 2019-03-08 ENCOUNTER — Other Ambulatory Visit: Payer: 59

## 2019-03-15 ENCOUNTER — Encounter: Payer: 59 | Admitting: Family Medicine

## 2019-04-18 ENCOUNTER — Other Ambulatory Visit: Payer: Self-pay | Admitting: Family Medicine

## 2019-04-18 DIAGNOSIS — I10 Essential (primary) hypertension: Secondary | ICD-10-CM

## 2019-04-18 MED ORDER — LISINOPRIL 5 MG PO TABS: 5.0000 mg | ORAL_TABLET | Freq: Every day | ORAL | 1 refills | Status: DC

## 2019-04-18 NOTE — Telephone Encounter (Signed)
Medication Refill - Medication: lisinopril (PRINIVIL,ZESTRIL) 5 MG tablet  Has the patient contacted their pharmacy? Yes - states she needs to contact PCP, no refills on file (Agent: If no, request that the patient contact the pharmacy for the refill.) (Agent: If yes, when and what did the pharmacy advise?)  Preferred Pharmacy (with phone number or street name):  [id number] - [place], [place] - [address] [phone] (Phone) [phone] (Fax)   Agent: Please be advised that RX refills may take up to 3 business days. We ask that you follow-up with your pharmacy.

## 2019-05-28 ENCOUNTER — Telehealth: Payer: Self-pay | Admitting: Family Medicine

## 2019-05-28 NOTE — Telephone Encounter (Signed)
Daughter was diagnosed with Factor 5 Leiden.  Her dr suggested that the mother get tested for this while she is getting her labs done.  Cal back [phone]

## 2019-05-29 ENCOUNTER — Other Ambulatory Visit: Payer: 59

## 2019-05-30 ENCOUNTER — Other Ambulatory Visit: Payer: Self-pay | Admitting: Family Medicine

## 2019-05-30 DIAGNOSIS — I10 Essential (primary) hypertension: Secondary | ICD-10-CM

## 2019-05-30 DIAGNOSIS — Z832 Family history of diseases of the blood and blood-forming organs and certain disorders involving the immune mechanism: Secondary | ICD-10-CM

## 2019-05-30 DIAGNOSIS — R7301 Impaired fasting glucose: Secondary | ICD-10-CM

## 2019-05-30 NOTE — Telephone Encounter (Signed)
I left a detailed message at the pts cell number with the information below. 

## 2019-05-30 NOTE — Telephone Encounter (Signed)
I have added this to bloodwork

## 2019-06-05 ENCOUNTER — Encounter: Payer: 59 | Admitting: Family Medicine

## 2019-07-16 ENCOUNTER — Other Ambulatory Visit: Payer: Self-pay

## 2019-07-16 ENCOUNTER — Other Ambulatory Visit (INDEPENDENT_AMBULATORY_CARE_PROVIDER_SITE_OTHER): Payer: BC Managed Care – PPO

## 2019-07-16 DIAGNOSIS — Z832 Family history of diseases of the blood and blood-forming organs and certain disorders involving the immune mechanism: Secondary | ICD-10-CM | POA: Diagnosis not present

## 2019-07-16 DIAGNOSIS — R7301 Impaired fasting glucose: Secondary | ICD-10-CM

## 2019-07-16 DIAGNOSIS — I10 Essential (primary) hypertension: Secondary | ICD-10-CM | POA: Diagnosis not present

## 2019-07-16 LAB — HEMOGLOBIN A1C: Hgb A1c MFr Bld: 5.7 % (ref 4.6–6.5)

## 2019-07-16 LAB — BASIC METABOLIC PANEL
BUN: 16 mg/dL (ref 6–23)
CO2: 27 mEq/L (ref 19–32)
Calcium: 9.4 mg/dL (ref 8.4–10.5)
Chloride: 103 mEq/L (ref 96–112)
Creatinine, Ser: 0.84 mg/dL (ref 0.40–1.20)
GFR: 69.05 mL/min (ref >60.00)
Glucose, Bld: 101 mg/dL — ABNORMAL HIGH (ref 70–99)
Potassium: 3.9 mEq/L (ref 3.5–5.1)
Sodium: 139 mEq/L (ref 135–145)

## 2019-07-18 ENCOUNTER — Telehealth: Payer: Self-pay | Admitting: Obstetrics and Gynecology

## 2019-07-18 NOTE — Telephone Encounter (Signed)
Left message on voicemail to call and reschedule cancelled appointment. °

## 2019-07-20 LAB — FACTOR 5 LEIDEN

## 2019-07-21 ENCOUNTER — Encounter: Payer: Self-pay | Admitting: Family Medicine

## 2019-07-21 DIAGNOSIS — D6851 Activated protein C resistance: Secondary | ICD-10-CM | POA: Insufficient documentation

## 2019-07-21 NOTE — Progress Notes (Signed)
Kaitlyn Garcia DOB: [birth year] Encounter date: 07/22/2019  This is a 58 y.o. female who presents for complete physical   History of present illness/Additional concerns: *factor V Leiden + for heterozygous  Took bp over weekend and was 124/86; 129/78; then this morning 134/78. Not had any lows. Still taking 5mg  of lisinopril.   Follows with Dr. Quincy Simmonds for gyn needs.  Does not see dermatology.   Past Medical History:  Diagnosis Date  . Adenoma   . High cholesterol   . Hypertension   . Polyp, colonic   . Seasonal affective disorder [facility])    Past Surgical History:  Procedure Laterality Date  . APPENDECTOMY  2000  . CHOLECYSTECTOMY  2001  . COLONOSCOPY    . DILATION AND CURETTAGE OF UTERUS    . [place]  2001  . OOPHORECTOMY Left 2002  . POLYPECTOMY    . TONSILLECTOMY  1965   No Known Allergies Current Meds  Medication Sig  . lisinopril (ZESTRIL) 5 MG tablet Take 1 tablet (5 mg total) by mouth daily.  . Probiotic Product (PROBIOTIC DAILY PO) Take 1 capsule by mouth daily.  . [DISCONTINUED] Ibuprofen (ADVIL PO) Take 1 tablet by mouth as needed.   Current Facility-Administered Medications for the 07/22/19 encounter (Office Visit) with Caren Macadam, MD  Medication  . 0.9 %  sodium chloride infusion   Social History   Tobacco Use  . Smoking status: Never Smoker  . Smokeless tobacco: Never Used  Substance Use Topics  . Alcohol use: Yes    Alcohol/week: 2.0 - 3.0 standard drinks    Types: 2 - 3 Glasses of wine per week    Comment: 3-4 glasses of wine a week   Family History  Problem Relation Age of Onset  . Hypertension Mother   . Osteoarthritis Mother   . Depression Mother   . High Cholesterol Mother   . Diabetes Father   . Hypertension Father   . Heart attack Father 90  . Heart disease Father   . High blood pressure Brother   . Arthritis Maternal Grandmother   . Stroke Maternal Grandfather   . High blood pressure Paternal Grandmother   .  Hearing loss Paternal Grandmother   . Hearing loss Paternal Grandfather   . Heart attack Paternal Grandfather   . Heart disease Paternal Grandfather   . Esophageal cancer Neg Hx   . Stomach cancer Neg Hx   . Rectal cancer Neg Hx   . Colon cancer Neg Hx      Review of Systems  Constitutional: Negative for activity change, appetite change, chills, fatigue, fever and unexpected weight change.  HENT: Negative for congestion, ear pain, hearing loss, sinus pressure, sinus pain, sore throat and trouble swallowing.   Eyes: Negative for pain and visual disturbance.  Respiratory: Negative for cough, chest tightness, shortness of breath and wheezing.   Cardiovascular: Negative for chest pain, palpitations and leg swelling.  Gastrointestinal: Negative for abdominal pain, blood in stool, constipation, diarrhea, nausea and vomiting.  Genitourinary: Negative for difficulty urinating and menstrual problem.  Musculoskeletal: Negative for arthralgias and back pain.  Skin: Negative for rash.  Neurological: Negative for dizziness, weakness, numbness and headaches.  Hematological: Negative for adenopathy. Does not bruise/bleed easily.  Psychiatric/Behavioral: Negative for sleep disturbance and suicidal ideas. The patient is not nervous/anxious.     CBC:  Lab Results  Component Value Date   WBC 6.1 08/27/2018   HGB 13.4 08/27/2018   HGB 13.4 11/27/2015  HCT 39.6 08/27/2018   MCH 27.8 11/27/2015   MCHC 33.9 08/27/2018   RDW 13.9 08/27/2018   PLT 255.0 08/27/2018   MPV 10.0 11/27/2015   CMP: Lab Results  Component Value Date   NA 139 07/16/2019   K 3.9 07/16/2019   CL 103 07/16/2019   CO2 27 07/16/2019   GLUCOSE 101 (H) 07/16/2019   BUN 16 07/16/2019   CREATININE 0.84 07/16/2019   CREATININE 0.67 11/27/2015   CALCIUM 9.4 07/16/2019   PROT 6.8 08/27/2018   BILITOT 1.0 08/27/2018   ALKPHOS 71 08/27/2018   ALT 32 08/27/2018   AST 22 08/27/2018   LIPID: Lab Results  Component Value  Date   CHOL 203 (H) 08/27/2018   TRIG 103.0 08/27/2018   HDL 54.20 08/27/2018   LDLCALC 128 (H) 08/27/2018    Objective:  BP (!) 140/100 (BP Location: Left Arm, Patient Position: Sitting, Cuff Size: Normal)   Pulse 81   Temp 98.2 F (36.8 C) (Temporal)   Ht 5' 3.75" (1.619 m)   Wt 166 lb 1.6 oz (75.3 kg)   LMP 01/24/2008 (Approximate) Comment: spotting  SpO2 98%   BMI 28.74 kg/m   Weight: 166 lb 1.6 oz (75.3 kg)   BP Readings from Last 3 Encounters:  07/22/19 (!) 140/100  10/11/18 (!) 143/89  09/24/18 (!) 164/100   Wt Readings from Last 3 Encounters:  07/22/19 166 lb 1.6 oz (75.3 kg)  10/11/18 163 lb (73.9 kg)  10/01/18 163 lb 12.8 oz (74.3 kg)    Physical Exam Constitutional:      General: She is not in acute distress.    Appearance: She is well-developed.  HENT:     Head: Normocephalic and atraumatic.     Right Ear: External ear normal.     Left Ear: External ear normal.     Mouth/Throat:     Pharynx: No oropharyngeal exudate.  Eyes:     Conjunctiva/sclera: Conjunctivae normal.     Pupils: Pupils are equal, round, and reactive to light.  Neck:     Musculoskeletal: Normal range of motion and neck supple.     Thyroid: No thyromegaly.  Cardiovascular:     Rate and Rhythm: Normal rate and regular rhythm.     Heart sounds: Normal heart sounds. No murmur. No friction rub. No gallop.   Pulmonary:     Effort: Pulmonary effort is normal.     Breath sounds: Normal breath sounds.  Abdominal:     General: Bowel sounds are normal. There is no distension.     Palpations: Abdomen is soft. There is no mass.     Tenderness: There is no abdominal tenderness. There is no guarding.     Hernia: No hernia is present.  Musculoskeletal: Normal range of motion.        General: No tenderness or deformity.  Lymphadenopathy:     Cervical: No cervical adenopathy.  Skin:    General: Skin is warm and dry.     Findings: No rash.     Comments: Minimal small varicosities bilat legs;  nontender.  Scattered seborrheic keratosis torso.  Neurological:     Mental Status: She is alert and oriented to person, place, and time.     Deep Tendon Reflexes: Reflexes normal.     Reflex Scores:      Tricep reflexes are 2+ on the right side and 2+ on the left side.      Bicep reflexes are 2+ on the right side and 2+  on the left side.      Brachioradialis reflexes are 2+ on the right side and 2+ on the left side.      Patellar reflexes are 2+ on the right side and 2+ on the left side. Psychiatric:        Speech: Speech normal.        Behavior: Behavior normal.        Thought Content: Thought content normal.     Assessment/Plan: There are no preventive care reminders to display for this patient. Health Maintenance reviewed.  1. Preventative health care We discussed keeping up with exercise.  We discussed portion control to help with weight loss.  She eats a great variety overall, but portions can be improved on.  We also discussed counting calories to get an idea of daily caloric intake and shooting for a goal of 1200 to 1500 cal to help with weight loss.  2. Heterozygous factor V Leiden mutation ([place]) No further evaluation is needed, but patient had this completed due to daughter being positive for factor V.  We discussed importance of staying active, not sitting for prolonged periods of time (like road trips) and letting any future surgeons know about this condition.  We discussed it would be okay to take a baby aspirin when taking long trips.  3. Need for immunization against influenza - Flu Vaccine QUAD 6+ mos PF IM (Fluarix Quad PF)  4. Seborrheic keratosis Discussed that these are benign. Can remove if desired for cosmetic reasons.   Return in about 3 months (around 10/21/2019) for Chronic condition visit - give 30 min.  Micheline Rough, MD

## 2019-07-22 ENCOUNTER — Other Ambulatory Visit: Payer: Self-pay

## 2019-07-22 ENCOUNTER — Encounter: Payer: Self-pay | Admitting: Family Medicine

## 2019-07-22 ENCOUNTER — Ambulatory Visit (INDEPENDENT_AMBULATORY_CARE_PROVIDER_SITE_OTHER): Payer: BC Managed Care – PPO | Admitting: Family Medicine

## 2019-07-22 VITALS — BP 140/100 | HR 81 | Temp 98.2°F | Ht 63.75 in | Wt 166.1 lb

## 2019-07-22 DIAGNOSIS — D6851 Activated protein C resistance: Secondary | ICD-10-CM | POA: Diagnosis not present

## 2019-07-22 DIAGNOSIS — Z0000 Encounter for general adult medical examination without abnormal findings: Secondary | ICD-10-CM

## 2019-07-22 DIAGNOSIS — Z0001 Encounter for general adult medical examination with abnormal findings: Secondary | ICD-10-CM

## 2019-07-22 DIAGNOSIS — L821 Other seborrheic keratosis: Secondary | ICD-10-CM

## 2019-07-22 DIAGNOSIS — Z23 Encounter for immunization: Secondary | ICD-10-CM

## 2019-07-22 NOTE — Patient Instructions (Addendum)
Increase lisinopril to 2 tabs (10mg  total). Please keep checking pressures at home.   Consider apps like my fitness pal - shooting for calorie intake of 1200-1500. You do not get to add in calories you burn exercising.

## 2019-09-25 ENCOUNTER — Other Ambulatory Visit: Payer: Self-pay | Admitting: Family Medicine

## 2019-09-25 DIAGNOSIS — I10 Essential (primary) hypertension: Secondary | ICD-10-CM

## 2019-09-25 NOTE — Telephone Encounter (Signed)
Last OV 07/22/19 Last refill 04/18/19 #90/1 Next OV 10/14/19

## 2019-09-27 ENCOUNTER — Ambulatory Visit: Payer: 59 | Admitting: Obstetrics and Gynecology

## 2019-10-14 ENCOUNTER — Ambulatory Visit: Payer: BC Managed Care – PPO | Admitting: Family Medicine

## 2019-10-30 ENCOUNTER — Other Ambulatory Visit: Payer: Self-pay | Admitting: Obstetrics and Gynecology

## 2019-10-30 DIAGNOSIS — Z1231 Encounter for screening mammogram for malignant neoplasm of breast: Secondary | ICD-10-CM

## 2019-11-07 ENCOUNTER — Ambulatory Visit
Admission: RE | Admit: 2019-11-07 | Discharge: 2019-11-07 | Disposition: A | Discharge status: Home / Self Care | Payer: BC Managed Care – PPO | Source: Ambulatory Visit | Attending: Obstetrics and Gynecology | Admitting: Obstetrics and Gynecology

## 2019-11-07 ENCOUNTER — Other Ambulatory Visit: Payer: Self-pay

## 2019-11-07 DIAGNOSIS — Z1231 Encounter for screening mammogram for malignant neoplasm of breast: Secondary | ICD-10-CM

## 2019-11-08 ENCOUNTER — Encounter: Payer: Self-pay | Admitting: Family Medicine

## 2019-11-08 ENCOUNTER — Ambulatory Visit: Payer: BC Managed Care – PPO | Admitting: Family Medicine

## 2019-11-08 ENCOUNTER — Other Ambulatory Visit: Payer: Self-pay

## 2019-11-08 VITALS — BP 160/100 | HR 87 | Temp 97.9°F | Ht 63.75 in | Wt 166.1 lb

## 2019-11-08 DIAGNOSIS — Z23 Encounter for immunization: Secondary | ICD-10-CM | POA: Diagnosis not present

## 2019-11-08 DIAGNOSIS — I10 Essential (primary) hypertension: Secondary | ICD-10-CM

## 2019-11-08 DIAGNOSIS — E7800 Pure hypercholesterolemia, unspecified: Secondary | ICD-10-CM

## 2019-11-08 DIAGNOSIS — R002 Palpitations: Secondary | ICD-10-CM

## 2019-11-08 NOTE — Progress Notes (Addendum)
Kaitlyn Garcia DOB: 14-Oct-1961 Encounter date: 11/08/2019  This is a 59 y.o. female who presents with Chief Complaint  Patient presents with  . Follow-up    History of present illness: Last visit 06/2019  This morning when she got up (walking 2-4 miles daily) was 122/80. Up to 144/89 in afternoon.   In December she was not sleeping well with everything going on in world, family. Started to have a lot of dizziness and irregular heart rate. Then noticed it started happening after taking medication. Looked at printout from pharmacy and didn't see irregular heart rate but on [facility] saw it could affect K; and thus rhythm. Felt like sx diminished with her decrease to 5mg  daily. Increased this week to 10mg  daily and hasn't noted those sx. Sleeping better now and feeling better. Has some seasonal depression historically.   Her pressure was 150/84 when she checked with home cuff here in office.    No Known Allergies Current Meds  Medication Sig  . lisinopril (ZESTRIL) 5 MG tablet TAKE 1 TABLET BY MOUTH EVERY DAY  . Probiotic Product (PROBIOTIC DAILY PO) Take 1 capsule by mouth daily.  Marland Kitchen VITAMIN D PO Take 5,000 Units by mouth daily.   Current Facility-Administered Medications for the 11/08/19 encounter (Office Visit) with Caren Macadam, MD  Medication  . 0.9 %  sodium chloride infusion    Review of Systems  Constitutional: Negative for chills, fatigue and fever.  Respiratory: Negative for cough, chest tightness, shortness of breath and wheezing.   Cardiovascular: Positive for palpitations (only notes at rest, never with exertion). Negative for chest pain and leg swelling.    Objective:  BP (!) 160/100 (BP Location: Left Arm, Patient Position: Sitting, Cuff Size: Normal)   Pulse 87   Temp 97.9 F (36.6 C) (Temporal)   Ht 5' 3.75" (1.619 m)   Wt 166 lb 1.6 oz (75.3 kg)   LMP 01/24/2008 (Approximate) Comment: spotting  SpO2 98%   BMI 28.74 kg/m   Weight: 166 lb 1.6 oz  (75.3 kg)   BP Readings from Last 3 Encounters:  11/08/19 (!) 160/100  07/22/19 (!) 140/100  10/11/18 (!) 143/89   Wt Readings from Last 3 Encounters:  11/08/19 166 lb 1.6 oz (75.3 kg)  07/22/19 166 lb 1.6 oz (75.3 kg)  10/11/18 163 lb (73.9 kg)    Physical Exam Constitutional:      General: She is not in acute distress.    Appearance: She is well-developed.  Cardiovascular:     Rate and Rhythm: Normal rate and regular rhythm.     Heart sounds: Normal heart sounds. No murmur. No friction rub.  Pulmonary:     Effort: Pulmonary effort is normal. No respiratory distress.     Breath sounds: Normal breath sounds. No wheezing or rales.  Musculoskeletal:     Right lower leg: No edema.     Left lower leg: No edema.  Neurological:     Mental Status: She is alert and oriented to person, place, and time.  Psychiatric:        Behavior: Behavior normal.     Assessment/Plan  1. Hypertension, unspecified type Suboptimal control.  She has concerns that the lisinopril was causing palpitations.  We discussed trial of low-dose amlodipine, but I would like to see her blood work first. - CBC with Differential/Platelet; Future - Comprehensive metabolic panel; Future  2. Palpitations See above.  EKG stable, no acute changes. Normal sinus rythm.  On discussion, potential for  pain related to caffeine intake.  She will monitor and we will get some baseline blood work. - CBC with Differential/Platelet; Future - TSH; Future - EKG 12-Lead  3. High cholesterol - Lipid panel; Future  4. Need for shingles vaccine - Varicella-zoster vaccine IM (Shingrix)    Return for pending bloodwork.    Micheline Rough, MD

## 2019-11-11 ENCOUNTER — Other Ambulatory Visit: Payer: Self-pay

## 2019-11-11 ENCOUNTER — Other Ambulatory Visit (INDEPENDENT_AMBULATORY_CARE_PROVIDER_SITE_OTHER): Payer: BC Managed Care – PPO

## 2019-11-11 DIAGNOSIS — E7800 Pure hypercholesterolemia, unspecified: Secondary | ICD-10-CM | POA: Diagnosis not present

## 2019-11-11 DIAGNOSIS — R002 Palpitations: Secondary | ICD-10-CM

## 2019-11-11 DIAGNOSIS — I10 Essential (primary) hypertension: Secondary | ICD-10-CM | POA: Diagnosis not present

## 2019-11-11 LAB — COMPREHENSIVE METABOLIC PANEL
ALT: 26 U/L (ref 0–35)
AST: 19 U/L (ref 0–37)
Albumin: 4.6 g/dL (ref 3.5–5.2)
Alkaline Phosphatase: 70 U/L (ref 39–117)
BUN: 15 mg/dL (ref 6–23)
CO2: 27 mEq/L (ref 19–32)
Calcium: 9.7 mg/dL (ref 8.4–10.5)
Chloride: 103 mEq/L (ref 96–112)
Creatinine, Ser: 0.69 mg/dL (ref 0.40–1.20)
GFR: 86.55 mL/min (ref >60.00)
Glucose, Bld: 99 mg/dL (ref 70–99)
Potassium: 3.8 mEq/L (ref 3.5–5.1)
Sodium: 139 mEq/L (ref 135–145)
Total Bilirubin: 1 mg/dL (ref 0.2–1.2)
Total Protein: 7.2 g/dL (ref 6.0–8.3)

## 2019-11-11 LAB — CBC WITH DIFFERENTIAL/PLATELET
Basophils Absolute: 0.1 10*3/uL (ref 0.0–0.1)
Basophils Relative: 1.1 % (ref 0.0–3.0)
Eosinophils Absolute: 0.1 10*3/uL (ref 0.0–0.7)
Eosinophils Relative: 2.4 % (ref 0.0–5.0)
HCT: 41 % (ref 36.0–46.0)
Hemoglobin: 13.5 g/dL (ref 12.0–15.0)
Lymphocytes Relative: 42.2 % (ref 12.0–46.0)
Lymphs Abs: 2.6 10*3/uL (ref 0.7–4.0)
MCHC: 33 g/dL (ref 30.0–36.0)
MCV: 85.1 fl (ref 78.0–100.0)
Monocytes Absolute: 0.5 10*3/uL (ref 0.1–1.0)
Monocytes Relative: 7.9 % (ref 3.0–12.0)
Neutro Abs: 2.8 10*3/uL (ref 1.4–7.7)
Neutrophils Relative %: 46.4 % (ref 43.0–77.0)
Platelets: 252 10*3/uL (ref 150.0–400.0)
RBC: 4.82 Mil/uL (ref 3.87–5.11)
RDW: 14 % (ref 11.5–15.5)
WBC: 6.1 10*3/uL (ref 4.0–10.5)

## 2019-11-11 LAB — LIPID PANEL
Cholesterol: 226 mg/dL — ABNORMAL HIGH (ref 0–200)
HDL: 62.5 mg/dL (ref >39.00)
LDL Cholesterol: 143 mg/dL — ABNORMAL HIGH (ref 0–99)
NonHDL: 163.22
Total CHOL/HDL Ratio: 4
Triglycerides: 101 mg/dL (ref 0.0–149.0)
VLDL: 20.2 mg/dL (ref 0.0–40.0)

## 2019-11-11 LAB — TSH: TSH: 2 u[IU]/mL (ref 0.35–4.50)

## 2019-11-11 NOTE — Progress Notes (Signed)
58 y.o. G28P0012 Married Caucasian female here for annual exam.    Having some difficulty with controlling her blood pressure.   She wants to loose weight.   PCP: Micheline Rough, MD    Patient's last menstrual period was 01/24/2008 (approximate).           Sexually active: Yes.    The current method of family planning is post menopausal status.    Exercising: Yes.    walks 2-4 miles/every other day Smoker:  no  Health Maintenance: Pap: 09-24-18 Neg:Neg HR HPV,11-21-14 Neg:Neg HR HPV, 11-08-13 Neg:Neg HR HPV History of abnormal Pap:  Yes, 12/15/10 ASCUS HPV Neg, Colpo CIN 1. History of positive HR HPV and ASCUS pap in 2011  MMG: 11-07-19 Neg/density C/BiRads1  Colonoscopy: 10-11-18 tubular adenomatous polyp;next 09/2023 BMD:   n/a  Result  n/a TDaP:  11-08-13 Gardasil:   no HIV:11-27-15 NR Hep C: 11-27-15 Neg Screening Labs:  PCP.  Shingrix:  Completed first vaccine.  Flu vaccine:  Completed.    reports that she has never smoked. She has never used smokeless tobacco. She reports current alcohol use of about 2.0 - 3.0 standard drinks of alcohol per week. She reports that she does not use drugs.  Past Medical History:  Diagnosis Date  . Adenoma   . High cholesterol   . Hypertension   . Polyp, colonic   . Seasonal affective disorder [facility])     Past Surgical History:  Procedure Laterality Date  . APPENDECTOMY  2000  . CHOLECYSTECTOMY  2001  . COLONOSCOPY    . DILATION AND CURETTAGE OF UTERUS    . [place]  2001  . OOPHORECTOMY Left 2002  . POLYPECTOMY    . TONSILLECTOMY  1965    Current Outpatient Medications  Medication Sig Dispense Refill  . lisinopril (ZESTRIL) 5 MG tablet TAKE 1 TABLET BY MOUTH EVERY DAY 90 tablet 1  . Probiotic Product (PROBIOTIC DAILY PO) Take 1 capsule by mouth daily.    Marland Kitchen VITAMIN D PO Take 5,000 Units by mouth daily.     Current Facility-Administered Medications  Medication Dose Route Frequency Provider Last Rate Last Admin   . 0.9 %  sodium chloride infusion  500 mL Intravenous Once Pyrtle, Lajuan Lines, MD        Family History  Problem Relation Age of Onset  . Hypertension Mother   . Osteoarthritis Mother   . Depression Mother   . High Cholesterol Mother   . Diabetes Father   . Hypertension Father   . Heart attack Father 66  . Heart disease Father   . High blood pressure Brother   . Arthritis Maternal Grandmother   . Stroke Maternal Grandfather   . High blood pressure Paternal Grandmother   . Hearing loss Paternal Grandmother   . Hearing loss Paternal Grandfather   . Heart attack Paternal Grandfather   . Heart disease Paternal Grandfather   . Esophageal cancer Neg Hx   . Stomach cancer Neg Hx   . Rectal cancer Neg Hx   . Colon cancer Neg Hx     Review of Systems  All other systems reviewed and are negative.   Exam:   BP (!) 162/100 Comment: PCP is adjusting meds  Pulse 60   Temp (!) 97.2 F (36.2 C) (Temporal)   Resp 14   Ht 5' 3.5" (1.613 m)   Wt 166 lb (75.3 kg)   LMP 01/24/2008 (Approximate) Comment: spotting  BMI 28.94 kg/m  General appearance: alert, cooperative and appears stated age Head: normocephalic, without obvious abnormality, atraumatic Neck: no adenopathy, supple, symmetrical, trachea midline and thyroid normal to inspection and palpation Lungs: clear to auscultation bilaterally Breasts: normal appearance, no masses or tenderness, No nipple retraction or dimpling, No nipple discharge or bleeding, No axillary adenopathy Heart: regular rate and rhythm Abdomen: soft, non-tender; no masses, no organomegaly Extremities: extremities normal, atraumatic, no cyanosis or edema Skin: skin color, texture, turgor normal. No rashes or lesions Lymph nodes: cervical, supraclavicular, and axillary nodes normal. Neurologic: grossly normal  Pelvic: External genitalia:  no lesions              No abnormal inguinal nodes palpated.              Urethra:  normal appearing urethra with no  masses, tenderness or lesions              Bartholins and Skenes: normal                 Vagina: normal appearing vagina with normal color and discharge, no lesions              Cervix: no lesions              Pap taken: No.  Bimanual Exam:  Uterus:  normal size, contour, position, consistency, mobility, non-tender              Adnexa: no mass, fullness, tenderness              Rectal exam: Yes.  .  Confirms.              Anus:  normal sphincter tone, no lesions  Chaperone was present for exam.  Assessment:   Well woman visit with normal exam. Hx prior LGSIL.  Elevated blood pressure.  Under care of PCP.  Elevated LDL cholesterol.  Plan: Mammogram screening discussed. Self breast awareness reviewed. Pap and HR HPV as above.  Consider pap in 2022.  Guidelines for Calcium, Vitamin D, regular exercise program including cardiovascular and weight bearing exercise. We reviewed a heart healthy diet.  Labs with PCP.  Follow up annually and prn.   After visit summary provided.

## 2019-11-12 ENCOUNTER — Ambulatory Visit: Payer: BC Managed Care – PPO | Admitting: Obstetrics and Gynecology

## 2019-11-12 ENCOUNTER — Encounter: Payer: Self-pay | Admitting: Obstetrics and Gynecology

## 2019-11-12 VITALS — BP 162/100 | HR 60 | Temp 97.2°F | Resp 14 | Ht 63.5 in | Wt 166.0 lb

## 2019-11-12 DIAGNOSIS — Z01419 Encounter for gynecological examination (general) (routine) without abnormal findings: Secondary | ICD-10-CM

## 2019-11-12 NOTE — Patient Instructions (Signed)

## 2019-11-13 ENCOUNTER — Other Ambulatory Visit: Payer: Self-pay | Admitting: Family Medicine

## 2019-11-13 MED ORDER — AMLODIPINE BESYLATE 2.5 MG PO TABS: 2.5000 mg | ORAL_TABLET | Freq: Every day | ORAL | 1 refills | Status: DC

## 2019-11-18 ENCOUNTER — Telehealth: Payer: Self-pay | Admitting: Family Medicine

## 2019-11-18 NOTE — Telephone Encounter (Signed)
Patient is returning call from Thursday about change of medication.

## 2019-11-19 NOTE — Telephone Encounter (Signed)
See results note from 1/25.

## 2019-12-20 ENCOUNTER — Encounter: Payer: Self-pay | Admitting: Family Medicine

## 2019-12-20 ENCOUNTER — Telehealth (INDEPENDENT_AMBULATORY_CARE_PROVIDER_SITE_OTHER): Payer: BC Managed Care – PPO | Admitting: Family Medicine

## 2019-12-20 ENCOUNTER — Other Ambulatory Visit: Payer: Self-pay

## 2019-12-20 DIAGNOSIS — I10 Essential (primary) hypertension: Secondary | ICD-10-CM

## 2019-12-20 MED ORDER — AMLODIPINE BESYLATE 2.5 MG PO TABS: 5.0000 mg | ORAL_TABLET | Freq: Every day | ORAL | 1 refills | Status: DC

## 2019-12-20 NOTE — Progress Notes (Signed)
Virtual Visit via Video Note  I connected with Kaitlyn Garcia  on 12/20/19 at 10:30 AM EST by a video enabled telemedicine application and verified that I am speaking with the correct person using two identifiers.  Location patient: home Location provider:work or home office Persons participating in the virtual visit: patient, provider  I discussed the limitations of evaluation and management by telemedicine and the availability of in person appointments. The patient expressed understanding and agreed to proceed.   Kaitlyn Garcia DOB: Jul 07, 1961 Encounter date: 12/20/2019  This is a 59 y.o. female who presents with Chief Complaint  Patient presents with  . Follow-up    History of present illness: After last labwork and visit in January; we started amlodipine 2.5mg  daily.  Things are going pretty well. She has been on the amlodipine at bedtime for a month. Feels like she does well with this. Not having the dizziness she was having - working on more water intake and more sleep.   BP in last 3 days: 149/86, 140/83. Cuff was used on husband and his pressure was accurate from doc visit.   Has been working on getting more exercise - walking more. Bought exercise bike as well.  She has lost 5 pounds since her last visit.     No Known Allergies Current Meds  Medication Sig  . amLODipine (NORVASC) 2.5 MG tablet Take 2 tablets (5 mg total) by mouth daily.  . Probiotic Product (PROBIOTIC DAILY PO) Take 1 capsule by mouth daily.  Marland Kitchen VITAMIN D PO Take 5,000 Units by mouth 2 (two) times a week.   . [DISCONTINUED] amLODipine (NORVASC) 2.5 MG tablet Take 1 tablet (2.5 mg total) by mouth daily.   Current Facility-Administered Medications for the 12/20/19 encounter (Telemedicine) with Caren Macadam, MD  Medication  . 0.9 %  sodium chloride infusion    Review of Systems  Constitutional: Negative for chills, fatigue and fever.  Respiratory: Negative for cough, chest tightness,  shortness of breath and wheezing.   Cardiovascular: Negative for chest pain, palpitations and leg swelling.  Neurological: Negative for dizziness and headaches.    Objective:  LMP 01/24/2008 (Approximate) Comment: spotting      BP Readings from Last 3 Encounters:  11/12/19 (!) 162/100  11/08/19 (!) 160/100  07/22/19 (!) 140/100   Wt Readings from Last 3 Encounters:  11/12/19 166 lb (75.3 kg)  11/08/19 166 lb 1.6 oz (75.3 kg)  07/22/19 166 lb 1.6 oz (75.3 kg)    EXAM:  GENERAL: alert, oriented, appears well and in no acute distress  HEENT: atraumatic, conjunctiva clear, no obvious abnormalities on inspection of external nose and ears  NECK: normal movements of the head and neck  LUNGS: on inspection no signs of respiratory distress, breathing rate appears normal, no obvious gross SOB, gasping or wheezing  CV: no obvious cyanosis  MS: moves all visible extremities without noticeable abnormality  PSYCH/NEURO: pleasant and cooperative, no obvious depression or anxiety, speech and thought processing grossly intact  Assessment/Plan  1. Hypertension, unspecified type Increase amlodipine to 5 mg daily.  Update me in 2 weeks through MyChart to let me know how pressures are doing.  She is still above goal with blood pressure treatment.  We did discuss side effects of lower extremity swelling with the amlodipine.  She will let me know if she has this.  Would certainly consider adding in diuretic to treatment if we are not reaching goal.  We also discussed limiting sodium intake to help  with blood pressure as well as potential edema.    No follow-ups on file.   I discussed the assessment and treatment plan with the patient. The patient was provided an opportunity to ask questions and all were answered. The patient agreed with the plan and demonstrated an understanding of the instructions.   The patient was advised to call back or seek an in-person evaluation if the symptoms worsen  or if the condition fails to improve as anticipated.  I provided 12 minutes of non-face-to-face time during this encounter.   Micheline Rough, MD

## 2020-01-09 ENCOUNTER — Encounter: Payer: Self-pay | Admitting: Family Medicine

## 2020-01-10 MED ORDER — AMLODIPINE BESYLATE 2.5 MG PO TABS: 7.5000 mg | ORAL_TABLET | Freq: Every day | ORAL | 1 refills | Status: DC

## 2020-02-15 ENCOUNTER — Ambulatory Visit: Payer: BC Managed Care – PPO | Attending: Internal Medicine

## 2020-02-15 DIAGNOSIS — Z23 Encounter for immunization: Secondary | ICD-10-CM

## 2020-02-15 NOTE — Progress Notes (Signed)
   Covid-19 Vaccination Clinic  Name:  Kaitlyn Garcia    MRN: [id number] DOB: August 17, 1961  02/15/2020  [name] was observed post Covid-19 immunization for 15 minutes without incident. She was provided with Vaccine Information Sheet and instruction to access the V-Safe system.   [name] was instructed to call 911 with any severe reactions post vaccine: Marland Kitchen Difficulty breathing  . Swelling of face and throat  . A fast heartbeat  . A bad rash all over body  . Dizziness and weakness   Immunizations Administered    Name Date Dose VIS Date Route   Pfizer COVID-19 Vaccine 02/15/2020 11:02 AM 0.3 mL 12/18/2018 Intramuscular   Manufacturer: [place]   Lot: [id number]   [place]: [id number]

## 2020-03-16 ENCOUNTER — Ambulatory Visit: Payer: BC Managed Care – PPO | Attending: Internal Medicine

## 2020-03-16 DIAGNOSIS — Z23 Encounter for immunization: Secondary | ICD-10-CM

## 2020-03-16 NOTE — Progress Notes (Signed)
° °  Covid-19 Vaccination Clinic  Name:  Kaitlyn Garcia    MRN: [id number] DOB: [birth year]  03/16/2020  Ms. Lorch was observed post Covid-19 immunization for 15 minutes without incident. She was provided with Vaccine Information Sheet and instruction to access the V-Safe system.   Ms. Losano was instructed to call 911 with any severe reactions post vaccine:  Difficulty breathing   Swelling of face and throat   A fast heartbeat   A bad rash all over body   Dizziness and weakness   Immunizations Administered    Name Date Dose VIS Date Route   Pfizer COVID-19 Vaccine 03/16/2020  1:34 PM 0.3 mL 12/18/2018 Intramuscular   Manufacturer: [place]   Lot: [id number]   [place]: [id number]

## 2020-06-20 ENCOUNTER — Other Ambulatory Visit: Payer: Self-pay | Admitting: Family Medicine

## 2020-06-30 ENCOUNTER — Other Ambulatory Visit: Payer: Self-pay | Admitting: Family Medicine

## 2020-07-01 DIAGNOSIS — Z20822 Contact with and (suspected) exposure to covid-19: Secondary | ICD-10-CM | POA: Diagnosis not present

## 2020-07-02 MED ORDER — AMLODIPINE BESYLATE 2.5 MG PO TABS: 2.5000 mg | ORAL_TABLET | Freq: Every day | ORAL | 1 refills | Status: DC

## 2020-07-23 ENCOUNTER — Other Ambulatory Visit: Payer: Self-pay | Admitting: Family Medicine

## 2020-09-27 DIAGNOSIS — Z20822 Contact with and (suspected) exposure to covid-19: Secondary | ICD-10-CM | POA: Diagnosis not present

## 2020-10-13 ENCOUNTER — Telehealth: Payer: BC Managed Care – PPO | Admitting: Family Medicine

## 2020-10-13 ENCOUNTER — Encounter: Payer: Self-pay | Admitting: Family Medicine

## 2020-10-13 DIAGNOSIS — M545 Low back pain, unspecified: Secondary | ICD-10-CM | POA: Diagnosis not present

## 2020-10-13 DIAGNOSIS — M62830 Muscle spasm of back: Secondary | ICD-10-CM | POA: Diagnosis not present

## 2020-10-13 NOTE — Progress Notes (Unsigned)
Sent virtual visit link and tried to reach patient by phone at the time of her scheduled virtual visit.  It appears on review of chart she went to urgent care instead.  Left a message for her to go ahead and log into the link if she still needed for virtual visit, or to contact the office if she needed to cancel or reschedule.  Did let her know that I would wait in the virtual space until 10 minutes past her appointment time.

## 2020-10-21 ENCOUNTER — Other Ambulatory Visit: Payer: Self-pay | Admitting: Family Medicine

## 2020-11-24 DIAGNOSIS — U071 COVID-19: Secondary | ICD-10-CM

## 2020-11-24 HISTORY — DX: COVID-19: U07.1

## 2020-12-02 ENCOUNTER — Ambulatory Visit: Payer: BC Managed Care – PPO | Admitting: Obstetrics and Gynecology

## 2020-12-06 DIAGNOSIS — Z20822 Contact with and (suspected) exposure to covid-19: Secondary | ICD-10-CM | POA: Diagnosis not present

## 2020-12-31 DIAGNOSIS — H04123 Dry eye syndrome of bilateral lacrimal glands: Secondary | ICD-10-CM | POA: Diagnosis not present

## 2020-12-31 DIAGNOSIS — H524 Presbyopia: Secondary | ICD-10-CM | POA: Diagnosis not present

## 2020-12-31 DIAGNOSIS — H2513 Age-related nuclear cataract, bilateral: Secondary | ICD-10-CM | POA: Diagnosis not present

## 2020-12-31 DIAGNOSIS — H5203 Hypermetropia, bilateral: Secondary | ICD-10-CM | POA: Diagnosis not present

## 2021-01-27 ENCOUNTER — Other Ambulatory Visit: Payer: Self-pay

## 2021-01-27 ENCOUNTER — Other Ambulatory Visit: Payer: Self-pay | Admitting: Obstetrics and Gynecology

## 2021-01-27 ENCOUNTER — Ambulatory Visit: Payer: BC Managed Care – PPO | Admitting: Obstetrics and Gynecology

## 2021-01-27 ENCOUNTER — Encounter: Payer: Self-pay | Admitting: Obstetrics and Gynecology

## 2021-01-27 VITALS — BP 142/80 | HR 100 | Ht 63.5 in | Wt 166.0 lb

## 2021-01-27 DIAGNOSIS — Z1231 Encounter for screening mammogram for malignant neoplasm of breast: Secondary | ICD-10-CM

## 2021-01-27 DIAGNOSIS — Z01419 Encounter for gynecological examination (general) (routine) without abnormal findings: Secondary | ICD-10-CM

## 2021-01-27 NOTE — Patient Instructions (Signed)

## 2021-01-27 NOTE — Progress Notes (Signed)
60 y.o. G32P0012 Married Caucasian female here for annual exam.    Had Covid in February this year.  Did receive her Covid booster prior to this.   PCP: Earl Lagos, MD    Patient's last menstrual period was 01/24/2008 (approximate).           Sexually active: Yes.    The current method of family planning is post menopausal status.    Exercising: No.  not regular exercise due to back issues Smoker:  no  Health Maintenance: Pap: 09-24-18 Neg:Neg HR HPV,11-21-14 Neg:Neg HR HPV, 11-08-13 Neg:Neg HR HPV History of abnormal Pap:  Yes, 12/15/10 ASCUS HPV Neg, Colpo CIN 1. History of positive HR HPV and ASCUS pap in 2011  MMG: 11-07-19 3D/Neg/BiRads1 -- appt. Next week Colonoscopy:  10-11-18 tubular adenomatous polyp;next 09/2023 BMD:   n/a  Result  n/a TDaP:  11-08-13 Gardasil:   no HIV:11-27-15 NR Hep C:11-27-15 Neg Screening Labs:  PCP   reports that she has never smoked. She has never used smokeless tobacco. She reports current alcohol use of about 2.0 - 3.0 standard drinks of alcohol per week. She reports that she does not use drugs.  Past Medical History:  Diagnosis Date  . Adenoma   . COVID 11/2020  . High cholesterol   . Hypertension   . Polyp, colonic   . Seasonal affective disorder [facility])     Past Surgical History:  Procedure Laterality Date  . APPENDECTOMY  2000  . CHOLECYSTECTOMY  2001  . COLONOSCOPY    . DILATION AND CURETTAGE OF UTERUS    . [place]  2001  . OOPHORECTOMY Left 2002  . POLYPECTOMY    . TONSILLECTOMY  1965    Current Outpatient Medications  Medication Sig Dispense Refill  . amLODipine (NORVASC) 2.5 MG tablet TAKE 3 TABLETS (7.5 MG TOTAL) BY MOUTH DAILY. 270 tablet 0   Current Facility-Administered Medications  Medication Dose Route Frequency Provider Last Rate Last Admin  . 0.9 %  sodium chloride infusion  500 mL Intravenous Once Pyrtle, Lajuan Lines, MD        Family History  Problem Relation Age of Onset  . Hypertension  Mother   . Osteoarthritis Mother   . Depression Mother   . High Cholesterol Mother   . Diabetes Father   . Hypertension Father   . Heart attack Father 58  . Heart disease Father   . High blood pressure Brother   . Arthritis Maternal Grandmother   . Stroke Maternal Grandfather   . High blood pressure Paternal Grandmother   . Hearing loss Paternal Grandmother   . Hearing loss Paternal Grandfather   . Heart attack Paternal Grandfather   . Heart disease Paternal Grandfather   . Esophageal cancer Neg Hx   . Stomach cancer Neg Hx   . Rectal cancer Neg Hx   . Colon cancer Neg Hx     Review of Systems  All other systems reviewed and are negative.   Exam:   BP (!) 142/80   Pulse 100   Ht 5' 3.5" (1.613 m)   Wt 166 lb (75.3 kg)   LMP 01/24/2008 (Approximate) Comment: spotting  SpO2 98%   BMI 28.94 kg/m     General appearance: alert, cooperative and appears stated age Head: normocephalic, without obvious abnormality, atraumatic Neck: no adenopathy, supple, symmetrical, trachea midline and thyroid normal to inspection and palpation Lungs: clear to auscultation bilaterally Breasts: normal appearance, no masses or tenderness, No nipple retraction or  dimpling, No nipple discharge or bleeding, No axillary adenopathy Heart: regular rate and rhythm Abdomen: soft, non-tender; no masses, no organomegaly Extremities: extremities normal, atraumatic, no cyanosis or edema Skin: skin color, texture, turgor normal. No rashes or lesions Lymph nodes: cervical, supraclavicular, and axillary nodes normal. Neurologic: grossly normal  Pelvic: External genitalia:  no lesions              No abnormal inguinal nodes palpated.              Urethra:  normal appearing urethra with no masses, tenderness or lesions              Bartholins and Skenes: normal                 Vagina: normal appearing vagina with normal color and discharge, no lesions              Cervix: no lesions              Pap taken:  No. Bimanual Exam:  Uterus:  normal size, contour, position, consistency, mobility, non-tender              Adnexa: no mass, fullness, tenderness              Rectal exam: Yes.  .  Confirms.              Anus:  normal sphincter tone, no lesions  Chaperone was present for exam.  Assessment:   Well woman visit with normal exam. Hx prior LGSIL.  Status post left oophorectomy.  Elevated LDL cholesterol.  Plan: Mammogram screening discussed. Self breast awareness reviewed. Pap and HR HPV 2024. Guidelines for Calcium, Vitamin D, regular exercise program including cardiovascular and weight bearing exercise.   Follow up annually and prn.

## 2021-02-01 ENCOUNTER — Ambulatory Visit
Admission: RE | Admit: 2021-02-01 | Discharge: 2021-02-01 | Disposition: A | Discharge status: Home / Self Care | Payer: BC Managed Care – PPO | Source: Ambulatory Visit | Attending: Obstetrics and Gynecology | Admitting: Obstetrics and Gynecology

## 2021-02-01 ENCOUNTER — Other Ambulatory Visit: Payer: Self-pay

## 2021-02-01 DIAGNOSIS — Z1231 Encounter for screening mammogram for malignant neoplasm of breast: Secondary | ICD-10-CM

## 2021-05-19 ENCOUNTER — Other Ambulatory Visit: Payer: Self-pay | Admitting: Family Medicine

## 2021-06-12 ENCOUNTER — Other Ambulatory Visit: Payer: Self-pay | Admitting: Family Medicine

## 2021-07-10 ENCOUNTER — Other Ambulatory Visit: Payer: Self-pay | Admitting: Family Medicine

## 2021-07-12 ENCOUNTER — Other Ambulatory Visit: Payer: Self-pay | Admitting: Family Medicine

## 2021-07-12 MED ORDER — AMLODIPINE BESYLATE 2.5 MG PO TABS: 7.5000 mg | ORAL_TABLET | Freq: Every day | ORAL | 0 refills | Status: DC

## 2021-07-29 ENCOUNTER — Encounter: Payer: Self-pay | Admitting: Family Medicine

## 2021-07-29 ENCOUNTER — Telehealth (INDEPENDENT_AMBULATORY_CARE_PROVIDER_SITE_OTHER): Payer: Self-pay | Admitting: Family Medicine

## 2021-07-29 VITALS — BP 143/89

## 2021-07-29 DIAGNOSIS — R059 Cough, unspecified: Secondary | ICD-10-CM

## 2021-07-29 DIAGNOSIS — H938X3 Other specified disorders of ear, bilateral: Secondary | ICD-10-CM

## 2021-07-29 DIAGNOSIS — R030 Elevated blood-pressure reading, without diagnosis of hypertension: Secondary | ICD-10-CM

## 2021-07-29 NOTE — Patient Instructions (Signed)
-  nasal saline twice daily  -could try Flonase 2 sprays each nostril daily for 2 weeks  -hold off on over the counter pills for cold and congestion and monitor the blood pressure. If running higher or have chest pain, difficulty breathing headaches or other concerns see inperson care. Otherwise, recheck at your visit later this month with your primary care doctor.   I hope you are feeling better soon!  Seek in person care promptly if your symptoms worsen, new concerns arise or you are not improving with treatment.   I help [facility] out with telemedicine visits on Tuesdays and Thursdays and am available for visits on those days. If you have any concerns or questions following this visit please schedule a follow up visit with your Primary Care doctor or seek care at a local urgent care clinic to avoid delays in care.

## 2021-07-29 NOTE — Progress Notes (Signed)
Virtual Visit via Video Note  I connected with Kaitlyn Garcia  on 07/29/21 at  3:00 PM EDT by a video enabled telemedicine application and verified that I am speaking with the correct person using two identifiers.  Location patient: home, [place] Location provider:work or home office Persons participating in the virtual visit: patient, provider  I discussed the limitations of evaluation and management by telemedicine and the availability of in person appointments. The patient expressed understanding and agreed to proceed.   HPI:  Acute telemedicine visit for congestion and cough: -Onset: 6-7 days ago Had neg covid test -Symptoms include:nasal congestion, cough, R>L ear congestion/popping at times/fullness (some body aches initially) -improving now today and feeling better -Denies:fever, CP, SOB, NVD, ear pain, inability to eat/drink/get out of bed -Has tried:some over the counter cold medications - is concerned they may be making her BP go up -Pertinent past medical history:see below -Pertinent medication allergies:No Known Allergies -COVID-19 vaccine status: Immunization History  Administered Date(s) Administered   Influenza,inj,Quad PF,6+ Mos 08/13/2018, 07/22/2019   PFIZER(Purple Top)SARS-COV-2 Vaccination 02/15/2020, 03/16/2020   Tdap 11/08/2013   Zoster Recombinat (Shingrix) 11/08/2019     ROS: See pertinent positives and negatives per HPI.  Past Medical History:  Diagnosis Date   Adenoma    COVID 11/2020   High cholesterol    Hypertension    Polyp, colonic    Seasonal affective disorder [facility])     Past Surgical History:  Procedure Laterality Date   APPENDECTOMY  2000   CHOLECYSTECTOMY  2001   COLONOSCOPY     DILATION AND CURETTAGE OF UTERUS     INCISIONAL HERNIA REPAIR  2001   OOPHORECTOMY Left 2002   POLYPECTOMY     TONSILLECTOMY  1965     Current Outpatient Medications:    amLODipine (NORVASC) 2.5 MG tablet, Take 3 tablets (7.5 mg total) by mouth daily., Disp: 90  tablet, Rfl: 0  Current Facility-Administered Medications:    0.9 %  sodium chloride infusion, 500 mL, Intravenous, Once, Pyrtle, Lajuan Lines, MD  EXAM:  VITALS per patient if applicable:  GENERAL: alert, oriented, appears well and in no acute distress  HEENT: atraumatic, conjunttiva clear, no obvious abnormalities on inspection of external nose and ears  NECK: normal movements of the head and neck  LUNGS: on inspection no signs of respiratory distress, breathing rate appears normal, no obvious gross SOB, gasping or wheezing  CV: no obvious cyanosis  MS: moves all visible extremities without noticeable abnormality  PSYCH/NEURO: pleasant and cooperative, no obvious depression or anxiety, speech and thought processing grossly intact  ASSESSMENT AND PLAN:  Discussed the following assessment and plan:  Cough, unspecified type  Sensation of fullness in both ears  Elevated blood pressure reading  -we discussed possible serious and likely etiologies, options for evaluation and workup, limitations of telemedicine visit vs in person visit, treatment, treatment risks and precautions. Pt is agreeable to treatment via telemedicine at this moment. Suspect possible VURI vs other with some ETD. She reports she is feeling better today. Opted for nasal saline and INS. Advised to stop the OTC decongestants and monitor BP. She reports she has follow up with PCP in the next few weeks so will monitor BP and recheck then.   Advised to seek prompt in person care if worsening, new symptoms arise, or if is not improving with treatment. Discussed options for inperson care if PCP office not available. Did let this patient know that I only do telemedicine on Tuesdays and Thursdays for [place].  Advised to schedule follow up visit with PCP or [facility] if any further questions or concerns to avoid delays in care.   I discussed the assessment and treatment plan with the patient. The patient was provided an opportunity to  ask questions and all were answered. The patient agreed with the plan and demonstrated an understanding of the instructions.     Lucretia Kern, DO

## 2021-08-03 ENCOUNTER — Other Ambulatory Visit: Payer: Self-pay | Admitting: Family Medicine

## 2021-08-06 ENCOUNTER — Ambulatory Visit: Payer: BLUE CROSS/BLUE SHIELD | Admitting: Family Medicine

## 2021-08-06 ENCOUNTER — Other Ambulatory Visit: Payer: Self-pay

## 2021-08-06 ENCOUNTER — Encounter: Payer: Self-pay | Admitting: Family Medicine

## 2021-08-06 VITALS — BP 140/92 | HR 97 | Temp 98.5°F | Ht 63.5 in | Wt 166.1 lb

## 2021-08-06 DIAGNOSIS — I10 Essential (primary) hypertension: Secondary | ICD-10-CM

## 2021-08-06 DIAGNOSIS — Z131 Encounter for screening for diabetes mellitus: Secondary | ICD-10-CM

## 2021-08-06 DIAGNOSIS — J069 Acute upper respiratory infection, unspecified: Secondary | ICD-10-CM

## 2021-08-06 DIAGNOSIS — M25552 Pain in left hip: Secondary | ICD-10-CM

## 2021-08-06 DIAGNOSIS — Z13220 Encounter for screening for lipoid disorders: Secondary | ICD-10-CM

## 2021-08-06 LAB — CBC WITH DIFFERENTIAL/PLATELET
Basophils Absolute: 0.1 10*3/uL (ref 0.0–0.1)
Basophils Relative: 0.8 % (ref 0.0–3.0)
Eosinophils Absolute: 0.1 10*3/uL (ref 0.0–0.7)
Eosinophils Relative: 1.7 % (ref 0.0–5.0)
HCT: 40.9 % (ref 36.0–46.0)
Hemoglobin: 13.4 g/dL (ref 12.0–15.0)
Lymphocytes Relative: 28.3 % (ref 12.0–46.0)
Lymphs Abs: 1.9 10*3/uL (ref 0.7–4.0)
MCHC: 32.8 g/dL (ref 30.0–36.0)
MCV: 83.9 fl (ref 78.0–100.0)
Monocytes Absolute: 0.5 10*3/uL (ref 0.1–1.0)
Monocytes Relative: 6.8 % (ref 3.0–12.0)
Neutro Abs: 4.1 10*3/uL (ref 1.4–7.7)
Neutrophils Relative %: 62.4 % (ref 43.0–77.0)
Platelets: 291 10*3/uL (ref 150.0–400.0)
RBC: 4.88 Mil/uL (ref 3.87–5.11)
RDW: 13.9 % (ref 11.5–15.5)
WBC: 6.6 10*3/uL (ref 4.0–10.5)

## 2021-08-06 LAB — COMPREHENSIVE METABOLIC PANEL
ALT: 39 U/L — ABNORMAL HIGH (ref 0–35)
AST: 25 U/L (ref 0–37)
Albumin: 4.7 g/dL (ref 3.5–5.2)
Alkaline Phosphatase: 79 U/L (ref 39–117)
BUN: 10 mg/dL (ref 6–23)
CO2: 26 mEq/L (ref 19–32)
Calcium: 9.4 mg/dL (ref 8.4–10.5)
Chloride: 104 mEq/L (ref 96–112)
Creatinine, Ser: 0.7 mg/dL (ref 0.40–1.20)
GFR: 92.61 mL/min (ref >60.00)
Glucose, Bld: 100 mg/dL — ABNORMAL HIGH (ref 70–99)
Potassium: 3.6 mEq/L (ref 3.5–5.1)
Sodium: 140 mEq/L (ref 135–145)
Total Bilirubin: 1 mg/dL (ref 0.2–1.2)
Total Protein: 7.6 g/dL (ref 6.0–8.3)

## 2021-08-06 LAB — LIPID PANEL
Cholesterol: 216 mg/dL — ABNORMAL HIGH (ref 0–200)
HDL: 58.5 mg/dL (ref >39.00)
LDL Cholesterol: 132 mg/dL — ABNORMAL HIGH (ref 0–99)
NonHDL: 157.49
Total CHOL/HDL Ratio: 4
Triglycerides: 128 mg/dL (ref 0.0–149.0)
VLDL: 25.6 mg/dL (ref 0.0–40.0)

## 2021-08-06 MED ORDER — AMLODIPINE BESYLATE 10 MG PO TABS: 10.0000 mg | ORAL_TABLET | Freq: Every day | ORAL | 1 refills | Status: DC

## 2021-08-06 NOTE — Progress Notes (Signed)
Brayton Mars DOB: July 08, 1961 Encounter date: 08/06/2021  This is a 60 y.o. female who presents with Chief Complaint  Patient presents with   Follow-up    History of present illness: Last visit with me was virtual 12/20/19.   HTN: has been checking at home - has been in lower 140-145/80's at home. Sometimes in 130/70. No swelling in legs. No chest pain, pressure. No headaches. Does get a little rush if drinking tea, but otherwise not.    She has done well with regular exercise- bought [place]aton during pandemic (bike). Has had some hip pain left hip. When riding bike regularly it feels better, but if sitting excessive amount or walking then feels like it is pinching. Feels this right in lateral side. Doesn't hurt her while she is riding.     No Known Allergies Current Meds  Medication Sig   amLODipine (NORVASC) 10 MG tablet Take 1 tablet (10 mg total) by mouth daily.   Multiple Vitamin (MULTIVITAMIN) capsule Take 1 capsule by mouth daily.   [DISCONTINUED] amLODipine (NORVASC) 2.5 MG tablet Take 3 tablets (7.5 mg total) by mouth daily.   Current Facility-Administered Medications for the 08/06/21 encounter (Office Visit) with Caren Macadam, MD  Medication   0.9 %  sodium chloride infusion    Review of Systems  Constitutional:  Negative for chills, fatigue and fever.  Respiratory:  Negative for cough, chest tightness, shortness of breath and wheezing.   Cardiovascular:  Negative for chest pain, palpitations and leg swelling.   Objective:  BP (!) 140/92 (BP Location: Left Arm, Patient Position: Sitting, Cuff Size: Large)   Pulse 97   Temp 98.5 F (36.9 C) (Oral)   Ht 5' 3.5" (1.613 m)   Wt 166 lb 1.6 oz (75.3 kg)   LMP 01/24/2008 (Approximate) Comment: spotting  SpO2 98%   BMI 28.96 kg/m   Weight: 166 lb 1.6 oz (75.3 kg)   BP Readings from Last 3 Encounters:  08/06/21 (!) 140/92  07/29/21 (!) 143/89  01/27/21 (!) 142/80   Wt Readings from Last 3 Encounters:   08/06/21 166 lb 1.6 oz (75.3 kg)  01/27/21 166 lb (75.3 kg)  11/12/19 166 lb (75.3 kg)    Physical Exam Constitutional:      General: She is not in acute distress.    Appearance: She is well-developed.  Cardiovascular:     Rate and Rhythm: Normal rate and regular rhythm.     Heart sounds: Normal heart sounds. No murmur heard.   No friction rub.  Pulmonary:     Effort: Pulmonary effort is normal. No respiratory distress.     Breath sounds: Normal breath sounds. No wheezing or rales.  Musculoskeletal:     Right lower leg: No edema.     Left lower leg: No edema.     Comments: No significant difficulty with ROM with left hip; no bursa tenderness or IT band tenderness.   Neurological:     Mental Status: She is alert and oriented to person, place, and time.  Psychiatric:        Behavior: Behavior normal.    Assessment/Plan  1. Hypertension, unspecified type We are going to increase amlodipine to 10mg  daily. Let me know if any increased swelling.   2. Upper respiratory tract infection, unspecified type Let me know if not improving. She has had back to back sx; would consider adding abx.  3. Left hip pain Ref to sports med.    Return in about 6 months (around  02/04/2022) for physical exam.      Micheline Rough, MD

## 2021-08-06 NOTE — Addendum Note (Signed)
Addended by: Amanda Cockayne on: 08/06/2021 02:32 PM   Modules accepted: Orders

## 2021-08-12 NOTE — Progress Notes (Signed)
[facility] [place] [address] [place] Phone: [phone] Subjective:   IVilma Meckel, am serving as a scribe for Dr. Hulan Saas. This visit occurred during the SARS-CoV-2 public health emergency.  Safety protocols were in place, including screening questions prior to the visit, additional usage of staff PPE, and extensive cleaning of exam room while observing appropriate contact time as indicated for disinfecting solutions.   I'm seeing this patient by the request  of:  Koberlein, Steele Berg, MD  CC: Left hip pain  [id number]  Narissa Beaufort is a 60 y.o. female coming in with complaint of L hip pain.  Patient during the pandemic did get a exercise bike and has been doing it.  Has had pain though on the left hip.  Patient states pain is pinching and has been on and off for years. Going up hill and stairs she feels it more. When on peloton, no pain, but after she feel the pinch. Stays in the joint, but sometimes radiates into the lower back.      Past Medical History:  Diagnosis Date   Adenoma    COVID 11/2020   High cholesterol    Hypertension    Polyp, colonic    Seasonal affective disorder ([place])    Past Surgical History:  Procedure Laterality Date   APPENDECTOMY  2000   CHOLECYSTECTOMY  2001   COLONOSCOPY     DILATION AND CURETTAGE OF UTERUS     INCISIONAL HERNIA REPAIR  2001   OOPHORECTOMY Left 2002   POLYPECTOMY     TONSILLECTOMY  1965   Social History   Socioeconomic History   Marital status: Married    Spouse name: Not on file   Number of children: Not on file   Years of education: Not on file   Highest education level: Not on file  Occupational History   Not on file  Tobacco Use   Smoking status: Never   Smokeless tobacco: Never  Vaping Use   Vaping Use: Never used  Substance and Sexual Activity   Alcohol use: Yes    Alcohol/week: 2.0 - 3.0 standard drinks    Types: 2 - 3 Glasses of wine per week     Comment: 2-3 glasses of wine a week   Drug use: No   Sexual activity: Yes    Partners: Male    Birth control/protection: Post-menopausal  Other Topics Concern   Not on file  Social History Narrative   Not on file   Social Determinants of Health   Financial Resource Strain: Not on file  Food Insecurity: Not on file  Transportation Needs: Not on file  Physical Activity: Not on file  Stress: Not on file  Social Connections: Not on file   No Known Allergies Family History  Problem Relation Age of Onset   Hypertension Mother    Osteoarthritis Mother    Depression Mother    High Cholesterol Mother    Diabetes Father    Hypertension Father    Heart attack Father 14   Heart disease Father    High blood pressure Brother    Arthritis Maternal Grandmother    Stroke Maternal Grandfather    High blood pressure Paternal Grandmother    Hearing loss Paternal Grandmother    Hearing loss Paternal Grandfather    Heart attack Paternal Grandfather    Heart disease Paternal Grandfather    Esophageal cancer Neg Hx    Stomach cancer Neg Hx  Rectal cancer Neg Hx    Colon cancer Neg Hx       Current Outpatient Medications (Cardiovascular):    amLODipine (NORVASC) 10 MG tablet, Take 1 tablet (10 mg total) by mouth daily.         Current Outpatient Medications (Other):    Multiple Vitamin (MULTIVITAMIN) capsule, Take 1 capsule by mouth daily.  Current Facility-Administered Medications (Other):    0.9 %  sodium chloride infusion   Reviewed prior external information including notes and imaging from  primary care provider As well as notes that were available from care everywhere and other healthcare systems.  Past medical history, social, surgical and family history all reviewed in electronic medical record.  No pertanent information unless stated regarding to the chief complaint.   Review of Systems:  No headache, visual changes, nausea, vomiting, diarrhea, constipation,  dizziness, abdominal pain, skin rash, fevers, chills, night sweats, weight loss, swollen lymph nodes, body aches, joint swelling, chest pain, shortness of breath, mood changes. POSITIVE muscle aches  Objective  Blood pressure (!) 152/84, pulse 98, height 5\' 3"  (1.6 m), weight 166 lb (75.3 kg), last menstrual period 01/24/2008, SpO2 98 %.   General: No apparent distress alert and oriented x3 mood and affect normal, dressed appropriately.  HEENT: Pupils equal, extraocular movements intact  Respiratory: Patient's speak in full sentences and does not appear short of breath  Cardiovascular: No lower extremity edema, non tender, no erythema  Gait normal with good balance and coordination.  MSK: Pain seems to be more localized to the greater trochanteric area on the left side.  Positive FABER test noted as well.  Negative straight leg test.  Mild pain over the sacroiliac joint.  5 out of 5 strength in lower extremities.  [id number]; 15 additional minutes spent for Therapeutic exercises as stated in above notes.  This included exercises focusing on stretching, strengthening, with significant focus on eccentric aspects.   Long term goals include an improvement in range of motion, strength, endurance as well as avoiding reinjury. Patient's frequency would include in 1-2 times a day, 3-5 times a week for a duration of 6-12 weeks. Hip strengthening exercises which included:  Pelvic tilt/bracing to help with proper recruitment of the lower abs and pelvic floor muscles  Glute strengthening to properly contract glutes without over-engaging low back and hamstrings - prone hip extension and glute bridge exercises Proper stretching techniques to increase effectiveness for the hip flexors, groin, quads, piriformic and low back when appropriate   Proper technique shown and discussed handout in great detail with ATC.  All questions were discussed and answered.       Impression and Recommendations:     The above  documentation has been reviewed and is accurate and complete Lyndal Pulley, DO

## 2021-08-16 ENCOUNTER — Encounter: Payer: Self-pay | Admitting: Family Medicine

## 2021-08-16 ENCOUNTER — Ambulatory Visit (INDEPENDENT_AMBULATORY_CARE_PROVIDER_SITE_OTHER): Payer: BC Managed Care – PPO

## 2021-08-16 ENCOUNTER — Other Ambulatory Visit: Payer: Self-pay

## 2021-08-16 ENCOUNTER — Ambulatory Visit (INDEPENDENT_AMBULATORY_CARE_PROVIDER_SITE_OTHER): Payer: BC Managed Care – PPO | Admitting: Family Medicine

## 2021-08-16 VITALS — BP 152/84 | HR 98 | Ht 63.0 in | Wt 166.0 lb

## 2021-08-16 DIAGNOSIS — M7062 Trochanteric bursitis, left hip: Secondary | ICD-10-CM

## 2021-08-16 DIAGNOSIS — M7072 Other bursitis of hip, left hip: Secondary | ICD-10-CM | POA: Insufficient documentation

## 2021-08-16 DIAGNOSIS — M25552 Pain in left hip: Secondary | ICD-10-CM | POA: Diagnosis not present

## 2021-08-16 DIAGNOSIS — M25559 Pain in unspecified hip: Secondary | ICD-10-CM | POA: Diagnosis not present

## 2021-08-16 NOTE — Assessment & Plan Note (Signed)
I believe the patient does have more of a left-sided hip bursitis.  Discussed icing regimen and home exercises, discussed which activities to do and which ones to avoid.  We discussed topical anti-inflammatories.  Work with Product/process development scientist to learn home exercises in greater detail.  We will get x-rays of the hip to further evaluate.  Follow-up consider the possibility of starting formal physical therapy and injection.

## 2021-08-16 NOTE — Patient Instructions (Addendum)
Xrays today Do prescribed exercises at least 3x a week Ice and Voltaren Lower seat on Peloton See you again in 6 weeks

## 2021-09-24 NOTE — Progress Notes (Signed)
[place] Phone: [phone] Subjective:    I'm seeing this patient by the request  of:  Caren Macadam, MD  CC: Left hip pain follow-up  [id number]  08/16/2021 I believe the patient does have more of a left-sided hip bursitis.  Discussed icing regimen and home exercises, discussed which activities to do and which ones to avoid.  We discussed topical anti-inflammatories.  Work with Product/process development scientist to learn home exercises in greater detail.  We will get x-rays of the hip to further evaluate.  Follow-up consider the possibility of starting formal physical therapy and injection.  Updated 09/27/2021 Kaitlyn Garcia is a 60 y.o. female coming in with complaint of L hip pain. States that pain was getting worse after traveling. Having more during the day over greater trochanter. Over the weekend pain radiated into her back. Is working on exercises which are now helping.   Xrays (-) no significant bony abnormality      Past Medical History:  Diagnosis Date   Adenoma    COVID 11/2020   High cholesterol    Hypertension    Polyp, colonic    Seasonal affective disorder ([place])    Past Surgical History:  Procedure Laterality Date   APPENDECTOMY  2000   CHOLECYSTECTOMY  2001   COLONOSCOPY     DILATION AND CURETTAGE OF UTERUS     INCISIONAL HERNIA REPAIR  2001   OOPHORECTOMY Left 2002   POLYPECTOMY     TONSILLECTOMY  1965   Social History   Socioeconomic History   Marital status: Married    Spouse name: Not on file   Number of children: Not on file   Years of education: Not on file   Highest education level: Not on file  Occupational History   Not on file  Tobacco Use   Smoking status: Never   Smokeless tobacco: Never  Vaping Use   Vaping Use: Never used  Substance and Sexual Activity   Alcohol use: Yes    Alcohol/week: 2.0 - 3.0 standard drinks    Types: 2 - 3 Glasses of wine per week     Comment: 2-3 glasses of wine a week   Drug use: No   Sexual activity: Yes    Partners: Male    Birth control/protection: Post-menopausal  Other Topics Concern   Not on file  Social History Narrative   Not on file   Social Determinants of Health   Financial Resource Strain: Not on file  Food Insecurity: Not on file  Transportation Needs: Not on file  Physical Activity: Not on file  Stress: Not on file  Social Connections: Not on file   No Known Allergies Family History  Problem Relation Age of Onset   Hypertension Mother    Osteoarthritis Mother    Depression Mother    High Cholesterol Mother    Diabetes Father    Hypertension Father    Heart attack Father 71   Heart disease Father    High blood pressure Brother    Arthritis Maternal Grandmother    Stroke Maternal Grandfather    High blood pressure Paternal Grandmother    Hearing loss Paternal Grandmother    Hearing loss Paternal Grandfather    Heart attack Paternal Grandfather    Heart disease Paternal Grandfather    Esophageal cancer Neg Hx    Stomach cancer Neg Hx    Rectal cancer Neg Hx    Colon  cancer Neg Hx       Current Outpatient Medications (Cardiovascular):    amLODipine (NORVASC) 10 MG tablet, Take 1 tablet (10 mg total) by mouth daily.         Current Outpatient Medications (Other):    Multiple Vitamin (MULTIVITAMIN) capsule, Take 1 capsule by mouth daily.  Current Facility-Administered Medications (Other):    0.9 %  sodium chloride infusion   Reviewed prior external information including notes and imaging from  primary care provider As well as notes that were available from care everywhere and other healthcare systems.  Past medical history, social, surgical and family history all reviewed in electronic medical record.  No pertanent information unless stated regarding to the chief complaint.   Review of Systems:  No headache, visual changes, nausea, vomiting, diarrhea, constipation,  dizziness, abdominal pain, skin rash, fevers, chills, night sweats, weight loss, swollen lymph nodes, body aches, joint swelling, chest pain, shortness of breath, mood changes. POSITIVE muscle aches  Objective  Blood pressure 130/80, pulse 89, height 5\' 3"  (1.6 m), weight 166 lb (75.3 kg), last menstrual period 01/24/2008, SpO2 98 %.   General: No apparent distress alert and oriented x3 mood and affect normal, dressed appropriately.  HEENT: Pupils equal, extraocular movements intact  Respiratory: Patient's speak in full sentences and does not appear short of breath  Cardiovascular: No lower extremity edema, non tender, no erythema  Gait normal with good balance and coordination.  MSK: Left hip exam shows the patient does have tenderness to palpation of the greater trochanteric area.  Positive FABER test.  Minimal pain over the paraspinal musculature of the lumbar spine.  Negative straight leg test.  After verbal consent patient was prepped with alcohol swab and with a 21-gauge 2 inch needle injected into the left greater trochanteric area with a total of 2 cc of 0.5% Marcaine and 1 cc of Kenalog 40 mg/mL.  No blood loss.  Band-Aid placed.  Postinjection instructions given.    Impression and Recommendations:     The above documentation has been reviewed and is accurate and complete Lyndal Pulley, DO

## 2021-09-27 ENCOUNTER — Encounter: Payer: Self-pay | Admitting: Family Medicine

## 2021-09-27 ENCOUNTER — Other Ambulatory Visit: Payer: Self-pay

## 2021-09-27 ENCOUNTER — Ambulatory Visit: Payer: BC Managed Care – PPO | Admitting: Family Medicine

## 2021-09-27 DIAGNOSIS — M7062 Trochanteric bursitis, left hip: Secondary | ICD-10-CM | POA: Diagnosis not present

## 2021-09-27 NOTE — Patient Instructions (Signed)
Good to see you  Ice 20 minutes 2 times daily. Usually after activity and before bed. Exercises 3 times a week.  You will do great  See me agai nin 6 weeks and if not perfect we will discuss PT or see if anything else is going on

## 2021-09-27 NOTE — Assessment & Plan Note (Signed)
Patient given injection and tolerated the procedure well, discussed icing regimen and home exercise, discussed which activities to do which wants to avoid.  Patient is very optimistic that she will make some improvement.  Worsening pain will consider the possibility of formal physical therapy.

## 2021-11-17 ENCOUNTER — Ambulatory Visit: Payer: BC Managed Care – PPO | Admitting: Family Medicine

## 2021-11-30 NOTE — Progress Notes (Signed)
[place] Phone: [phone] Subjective:   Fontaine No, am serving as a scribe for Dr. Hulan Saas.  This visit occurred during the SARS-CoV-2 public health emergency.  Safety protocols were in place, including screening questions prior to the visit, additional usage of staff PPE, and extensive cleaning of exam room while observing appropriate contact time as indicated for disinfecting solutions.  I'm seeing this patient by the request  of:  Caren Macadam, MD  CC:   [id number]  09/27/2021 Patient given injection and tolerated the procedure well, discussed icing regimen and home exercise, discussed which activities to do which wants to avoid.  Patient is very optimistic that she will make some improvement.  Worsening pain will consider the possibility of formal physical therapy.  Updated 12/01/2021 Kaitlyn Garcia is a 61 y.o. female coming in with complaint of left hip pain. Patient states that she has been having steady improvement since the injection. 95% better. Pain now with sitting for prolonged periods. Able to climb stairs without railing but does have pain intermittently with this movement.        Past Medical History:  Diagnosis Date   Adenoma    COVID 11/2020   High cholesterol    Hypertension    Polyp, colonic    Seasonal affective disorder ([place])    Past Surgical History:  Procedure Laterality Date   APPENDECTOMY  2000   CHOLECYSTECTOMY  2001   COLONOSCOPY     DILATION AND CURETTAGE OF UTERUS     INCISIONAL HERNIA REPAIR  2001   OOPHORECTOMY Left 2002   POLYPECTOMY     TONSILLECTOMY  1965   Social History   Socioeconomic History   Marital status: Married    Spouse name: Not on file   Number of children: Not on file   Years of education: Not on file   Highest education level: Not on file  Occupational History   Not on file  Tobacco Use   Smoking status: Never    Smokeless tobacco: Never  Vaping Use   Vaping Use: Never used  Substance and Sexual Activity   Alcohol use: Yes    Alcohol/week: 2.0 - 3.0 standard drinks    Types: 2 - 3 Glasses of wine per week    Comment: 2-3 glasses of wine a week   Drug use: No   Sexual activity: Yes    Partners: Male    Birth control/protection: Post-menopausal  Other Topics Concern   Not on file  Social History Narrative   Not on file   Social Determinants of Health   Financial Resource Strain: Not on file  Food Insecurity: Not on file  Transportation Needs: Not on file  Physical Activity: Not on file  Stress: Not on file  Social Connections: Not on file   No Known Allergies Family History  Problem Relation Age of Onset   Hypertension Mother    Osteoarthritis Mother    Depression Mother    High Cholesterol Mother    Diabetes Father    Hypertension Father    Heart attack Father 62   Heart disease Father    High blood pressure Brother    Arthritis Maternal Grandmother    Stroke Maternal Grandfather    High blood pressure Paternal Grandmother    Hearing loss Paternal Grandmother    Hearing loss Paternal Grandfather    Heart attack Paternal Grandfather    Heart disease Paternal  Grandfather    Esophageal cancer Neg Hx    Stomach cancer Neg Hx    Rectal cancer Neg Hx    Colon cancer Neg Hx       Current Outpatient Medications (Cardiovascular):    amLODipine (NORVASC) 10 MG tablet, Take 1 tablet (10 mg total) by mouth daily.         Current Outpatient Medications (Other):    Multiple Vitamin (MULTIVITAMIN) capsule, Take 1 capsule by mouth daily.  Current Facility-Administered Medications (Other):    0.9 %  sodium chloride infusion   Reviewed prior external information including notes and imaging from  primary care provider As well as notes that were available from care everywhere and other healthcare systems.  Past medical history, social, surgical and family history all  reviewed in electronic medical record.  No pertanent information unless stated regarding to the chief complaint.   Review of Systems:  No headache, visual changes, nausea, vomiting, diarrhea, constipation, dizziness, abdominal pain, skin rash, fevers, chills, night sweats, weight loss, swollen lymph nodes, body aches, joint swelling, chest pain, shortness of breath, mood changes. POSITIVE muscle aches  Objective  Last menstrual period 01/24/2008.   General: No apparent distress alert and oriented x3 mood and affect normal, dressed appropriately.  HEENT: Pupils equal, extraocular movements intact  Respiratory: Patient's speak in full sentences and does not appear short of breath  Cardiovascular: No lower extremity edema, non tender, no erythema  Gait normal with good balance and coordination.  MSK:  Non tender with full range of motion and good stability and symmetric strength and tone of shoulders, elbows, wrist, hip, knee and ankles bilaterally.     Impression and Recommendations:     The above documentation has been reviewed and is accurate and complete Delsa Sale

## 2021-12-01 ENCOUNTER — Ambulatory Visit: Payer: Self-pay

## 2021-12-01 ENCOUNTER — Other Ambulatory Visit: Payer: Self-pay

## 2021-12-01 ENCOUNTER — Ambulatory Visit: Payer: BC Managed Care – PPO | Admitting: Family Medicine

## 2021-12-01 VITALS — BP 130/88 | HR 85 | Ht 63.0 in | Wt 163.0 lb

## 2021-12-01 DIAGNOSIS — M7062 Trochanteric bursitis, left hip: Secondary | ICD-10-CM

## 2021-12-01 DIAGNOSIS — M25552 Pain in left hip: Secondary | ICD-10-CM

## 2021-12-01 NOTE — Patient Instructions (Signed)
I'm so glad you're doing great Tell your husband to keep up Try to ice after activity See you again in 2-3 months just in case

## 2021-12-01 NOTE — Progress Notes (Signed)
[place] Phone: [phone] Subjective:   Fontaine No, am serving as a scribe for Dr. Hulan Saas.  This visit occurred during the SARS-CoV-2 public health emergency.  Safety protocols were in place, including screening questions prior to the visit, additional usage of staff PPE, and extensive cleaning of exam room while observing appropriate contact time as indicated for disinfecting solutions.  I'm seeing this patient by the request  of:  Caren Macadam, MD  CC: Left hip pain  [id number]  09/27/2021 Patient given injection and tolerated the procedure well, discussed icing regimen and home exercise, discussed which activities to do which wants to avoid.  Patient is very optimistic that she will make some improvement.  Worsening pain will consider the possibility of formal physical therapy.  Updated 12/01/2021 Verneda Hollopeter is a 61 y.o. female coming in with complaint of left hip pain. Patient states that she has been having steady improvement since the injection. 95% better. Pain now with sitting for prolonged periods. Able to climb stairs without railing but does have pain intermittently with this movement.        Past Medical History:  Diagnosis Date   Adenoma    COVID 11/2020   High cholesterol    Hypertension    Polyp, colonic    Seasonal affective disorder ([place])    Past Surgical History:  Procedure Laterality Date   APPENDECTOMY  2000   CHOLECYSTECTOMY  2001   COLONOSCOPY     DILATION AND CURETTAGE OF UTERUS     INCISIONAL HERNIA REPAIR  2001   OOPHORECTOMY Left 2002   POLYPECTOMY     TONSILLECTOMY  1965   Social History   Socioeconomic History   Marital status: Married    Spouse name: Not on file   Number of children: Not on file   Years of education: Not on file   Highest education level: Not on file  Occupational History   Not on file  Tobacco Use   Smoking status:  Never   Smokeless tobacco: Never  Vaping Use   Vaping Use: Never used  Substance and Sexual Activity   Alcohol use: Yes    Alcohol/week: 2.0 - 3.0 standard drinks    Types: 2 - 3 Glasses of wine per week    Comment: 2-3 glasses of wine a week   Drug use: No   Sexual activity: Yes    Partners: Male    Birth control/protection: Post-menopausal  Other Topics Concern   Not on file  Social History Narrative   Not on file   Social Determinants of Health   Financial Resource Strain: Not on file  Food Insecurity: Not on file  Transportation Needs: Not on file  Physical Activity: Not on file  Stress: Not on file  Social Connections: Not on file   No Known Allergies Family History  Problem Relation Age of Onset   Hypertension Mother    Osteoarthritis Mother    Depression Mother    High Cholesterol Mother    Diabetes Father    Hypertension Father    Heart attack Father 66   Heart disease Father    High blood pressure Brother    Arthritis Maternal Grandmother    Stroke Maternal Grandfather    High blood pressure Paternal Grandmother    Hearing loss Paternal Grandmother    Hearing loss Paternal Grandfather    Heart attack Paternal Grandfather    Heart  disease Paternal Grandfather    Esophageal cancer Neg Hx    Stomach cancer Neg Hx    Rectal cancer Neg Hx    Colon cancer Neg Hx       Current Outpatient Medications (Cardiovascular):    amLODipine (NORVASC) 10 MG tablet, Take 1 tablet (10 mg total) by mouth daily.         Current Outpatient Medications (Other):    Multiple Vitamin (MULTIVITAMIN) capsule, Take 1 capsule by mouth daily.  Current Facility-Administered Medications (Other):    0.9 %  sodium chloride infusion   Objective  Blood pressure 130/88, pulse 85, height 5\' 3"  (1.6 m), weight 163 lb (73.9 kg), last menstrual period 01/24/2008, SpO2 98 %.   General: No apparent distress alert and oriented x3 mood and affect normal, dressed appropriately.   HEENT: Pupils equal, extraocular movements intact  Respiratory: Patient's speak in full sentences and does not appear short of breath  Cardiovascular: No lower extremity edema, non tender, no erythema  Gait normal with good balance and coordination.  MSK: Left hip does have some very mild tenderness only noted over the greater trochanteric area.  Patient has good range of motion of the hip noted.  No flank 5 out of 5 strength.  Mild tenderness over the right greater trochanteric area as well.  No tenderness over the back.    Impression and Recommendations:     The above documentation has been reviewed and is accurate and complete Lyndal Pulley, DO

## 2021-12-01 NOTE — Assessment & Plan Note (Signed)
Patient significantly better at this time.  No need in change in any of the treatment plan at this point.  Discussed icing regimen and home exercises.  Follow-up with me again next 3 months if not completely resolved then we will consider the possibility of an injection again.

## 2022-01-27 NOTE — Progress Notes (Deleted)
?Kaitlyn Garcia D.O. ?[facility] ?[place] ?Phone: [phone] ?Subjective:   ? ?I'm seeing this patient by the request  of:  Caren Macadam, MD ? ?CC:  ? ?OFB:PZWCHENIDP  ?12/01/2021 ?Patient significantly better at this time.  No need in change in any of the treatment plan at this point.  Discussed icing regimen and home exercises.  Follow-up with me again next 3 months if not completely resolved then we will consider the possibility of an injection again. ? ?Updated 01/31/2022 ?Kaitlyn Garcia is a 61 y.o. female coming in with complaint of left hip pain ? ? ? ?  ? ?Past Medical History:  ?Diagnosis Date  ? Adenoma   ? COVID 11/2020  ? High cholesterol   ? Hypertension   ? Polyp, colonic   ? Seasonal affective disorder ([place])   ? ?Past Surgical History:  ?Procedure Laterality Date  ? APPENDECTOMY  2000  ? CHOLECYSTECTOMY  2001  ? COLONOSCOPY    ? DILATION AND CURETTAGE OF UTERUS    ? [place]  2001  ? OOPHORECTOMY Left 2002  ? POLYPECTOMY    ? TONSILLECTOMY  1965  ? ?Social History  ? ?Socioeconomic History  ? Marital status: Married  ?  Spouse name: Not on file  ? Number of children: Not on file  ? Years of education: Not on file  ? Highest education level: Not on file  ?Occupational History  ? Not on file  ?Tobacco Use  ? Smoking status: Never  ? Smokeless tobacco: Never  ?Vaping Use  ? Vaping Use: Never used  ?Substance and Sexual Activity  ? Alcohol use: Yes  ?  Alcohol/week: 2.0 - 3.0 standard drinks  ?  Types: 2 - 3 Glasses of wine per week  ?  Comment: 2-3 glasses of wine a week  ? Drug use: No  ? Sexual activity: Yes  ?  Partners: Male  ?  Birth control/protection: Post-menopausal  ?Other Topics Concern  ? Not on file  ?Social History Narrative  ? Not on file  ? ?Social Determinants of Health  ? ?Financial Resource Strain: Not on file  ?Food Insecurity: Not on file  ?Transportation Needs: Not on file  ?Physical Activity: Not on file  ?Stress: Not  on file  ?Social Connections: Not on file  ? ?No Known Allergies ?Family History  ?Problem Relation Age of Onset  ? Hypertension Mother   ? Osteoarthritis Mother   ? Depression Mother   ? High Cholesterol Mother   ? Diabetes Father   ? Hypertension Father   ? Heart attack Father 39  ? Heart disease Father   ? High blood pressure Brother   ? Arthritis Maternal Grandmother   ? Stroke Maternal Grandfather   ? High blood pressure Paternal Grandmother   ? Hearing loss Paternal Grandmother   ? Hearing loss Paternal Grandfather   ? Heart attack Paternal Grandfather   ? Heart disease Paternal Grandfather   ? Esophageal cancer Neg Hx   ? Stomach cancer Neg Hx   ? Rectal cancer Neg Hx   ? Colon cancer Neg Hx   ? ? ? ? ?Current Outpatient Medications (Cardiovascular):  ?  amLODipine (NORVASC) 10 MG tablet, Take 1 tablet (10 mg total) by mouth daily. ? ? ? ? ? ? ? ? ?Current Outpatient Medications (Other):  ?  Multiple Vitamin (MULTIVITAMIN) capsule, Take 1 capsule by mouth daily. ? ?Current Facility-Administered Medications (Other):  ?  0.9 %  sodium chloride infusion ? ? ?Reviewed prior external information including notes and imaging from  ?primary care provider ?As well as notes that were available from care everywhere and other healthcare systems. ? ?Past medical history, social, surgical and family history all reviewed in electronic medical record.  No pertanent information unless stated regarding to the chief complaint.  ? ?Review of Systems: ? No headache, visual changes, nausea, vomiting, diarrhea, constipation, dizziness, abdominal pain, skin rash, fevers, chills, night sweats, weight loss, swollen lymph nodes, body aches, joint swelling, chest pain, shortness of breath, mood changes. POSITIVE muscle aches ? ?Objective  ?Last menstrual period 01/24/2008. ?  ?General: No apparent distress alert and oriented x3 mood and affect normal, dressed appropriately.  ?HEENT: Pupils equal, extraocular movements intact   ?Respiratory: Patient's speak in full sentences and does not appear short of breath  ?Cardiovascular: No lower extremity edema, non tender, no erythema  ?Gait normal with good balance and coordination.  ?MSK:  Non tender with full range of motion and good stability and symmetric strength and tone of shoulders, elbows, wrist, hip, knee and ankles bilaterally.  ? ?  ?Impression and Recommendations:  ?  ? ?The above documentation has been reviewed and is accurate and complete Kaitlyn Garcia ? ? ?

## 2022-01-31 ENCOUNTER — Ambulatory Visit: Payer: BC Managed Care – PPO | Admitting: Family Medicine

## 2022-02-02 ENCOUNTER — Other Ambulatory Visit: Payer: Self-pay | Admitting: Family Medicine

## 2022-02-02 DIAGNOSIS — I10 Essential (primary) hypertension: Secondary | ICD-10-CM

## 2022-02-07 ENCOUNTER — Encounter: Payer: Self-pay | Admitting: Family Medicine

## 2022-02-07 ENCOUNTER — Ambulatory Visit (INDEPENDENT_AMBULATORY_CARE_PROVIDER_SITE_OTHER): Payer: BC Managed Care – PPO | Admitting: Family Medicine

## 2022-02-07 VITALS — BP 120/78 | HR 70 | Temp 98.3°F | Ht 63.5 in | Wt 167.2 lb

## 2022-02-07 DIAGNOSIS — R739 Hyperglycemia, unspecified: Secondary | ICD-10-CM | POA: Diagnosis not present

## 2022-02-07 DIAGNOSIS — E785 Hyperlipidemia, unspecified: Secondary | ICD-10-CM

## 2022-02-07 DIAGNOSIS — Z0000 Encounter for general adult medical examination without abnormal findings: Secondary | ICD-10-CM | POA: Diagnosis not present

## 2022-02-07 DIAGNOSIS — L989 Disorder of the skin and subcutaneous tissue, unspecified: Secondary | ICD-10-CM

## 2022-02-07 DIAGNOSIS — I10 Essential (primary) hypertension: Secondary | ICD-10-CM | POA: Diagnosis not present

## 2022-02-07 LAB — CBC WITH DIFFERENTIAL/PLATELET
Basophils Absolute: 0.1 10*3/uL (ref 0.0–0.1)
Basophils Relative: 1 % (ref 0.0–3.0)
Eosinophils Absolute: 0.1 10*3/uL (ref 0.0–0.7)
Eosinophils Relative: 1.5 % (ref 0.0–5.0)
HCT: 41.4 % (ref 36.0–46.0)
Hemoglobin: 13.9 g/dL (ref 12.0–15.0)
Lymphocytes Relative: 33.5 % (ref 12.0–46.0)
Lymphs Abs: 2 10*3/uL (ref 0.7–4.0)
MCHC: 33.5 g/dL (ref 30.0–36.0)
MCV: 84.6 fl (ref 78.0–100.0)
Monocytes Absolute: 0.4 10*3/uL (ref 0.1–1.0)
Monocytes Relative: 6.5 % (ref 3.0–12.0)
Neutro Abs: 3.5 10*3/uL (ref 1.4–7.7)
Neutrophils Relative %: 57.5 % (ref 43.0–77.0)
Platelets: 282 10*3/uL (ref 150.0–400.0)
RBC: 4.89 Mil/uL (ref 3.87–5.11)
RDW: 14.3 % (ref 11.5–15.5)
WBC: 6 10*3/uL (ref 4.0–10.5)

## 2022-02-07 LAB — LIPID PANEL
Cholesterol: 232 mg/dL — ABNORMAL HIGH (ref 0–200)
HDL: 71.8 mg/dL (ref >39.00)
LDL Cholesterol: 141 mg/dL — ABNORMAL HIGH (ref 0–99)
NonHDL: 159.94
Total CHOL/HDL Ratio: 3
Triglycerides: 93 mg/dL (ref 0.0–149.0)
VLDL: 18.6 mg/dL (ref 0.0–40.0)

## 2022-02-07 LAB — COMPREHENSIVE METABOLIC PANEL
ALT: 40 U/L — ABNORMAL HIGH (ref 0–35)
AST: 30 U/L (ref 0–37)
Albumin: 4.8 g/dL (ref 3.5–5.2)
Alkaline Phosphatase: 72 U/L (ref 39–117)
BUN: 14 mg/dL (ref 6–23)
CO2: 29 mEq/L (ref 19–32)
Calcium: 9.5 mg/dL (ref 8.4–10.5)
Chloride: 103 mEq/L (ref 96–112)
Creatinine, Ser: 0.7 mg/dL (ref 0.40–1.20)
GFR: 92.28 mL/min (ref >60.00)
Glucose, Bld: 118 mg/dL — ABNORMAL HIGH (ref 70–99)
Potassium: 3.7 mEq/L (ref 3.5–5.1)
Sodium: 140 mEq/L (ref 135–145)
Total Bilirubin: 1 mg/dL (ref 0.2–1.2)
Total Protein: 7.8 g/dL (ref 6.0–8.3)

## 2022-02-07 LAB — HEMOGLOBIN A1C: Hgb A1c MFr Bld: 5.7 % (ref 4.6–6.5)

## 2022-02-07 NOTE — Progress Notes (Signed)
Kaitlyn Garcia ?DOB: [birth year] ?Encounter date: 02/07/2022 ? ?This is a 61 y.o. female who presents for complete physical  ? ?History of present illness/Additional concerns: ?Last visit was 07/2021.  ? ?HTN: amlodipine increased to '10mg'$  daily at last visit.  Bp running similarly to this at home. Not getting any swelling with the amlodipine. ? ? ?Still exercising regularly but less in recent weeks. Planning to get back on peleton. Hip is feeling much better after working with Dr. Tamala Julian.  ? ?Mammogram due this month - will set up appointment.  ?Follows with dr. Quincy Simmonds for gyn care.  ?Due 09/2023 for next colonoscopy ? ? ?Past Medical History:  ?Diagnosis Date  ? Adenoma   ? COVID 11/2020  ? High cholesterol   ? Hypertension   ? Polyp, colonic   ? Seasonal affective disorder ([place])   ? ?Past Surgical History:  ?Procedure Laterality Date  ? APPENDECTOMY  2000  ? CHOLECYSTECTOMY  2001  ? COLONOSCOPY    ? DILATION AND CURETTAGE OF UTERUS    ? [place]  2001  ? OOPHORECTOMY Left 2002  ? POLYPECTOMY    ? TONSILLECTOMY  1965  ? ?No Known Allergies ?Current Meds  ?Medication Sig  ? amLODipine (NORVASC) 10 MG tablet TAKE 1 TABLET BY MOUTH EVERY DAY  ? MAGNESIUM PO Take by mouth daily.  ? Multiple Vitamin (MULTIVITAMIN) capsule Take 1 capsule by mouth daily.  ? ?Current Facility-Administered Medications for the 02/07/22 encounter (Office Visit) with Caren Macadam, MD  ?Medication  ? 0.9 %  sodium chloride infusion  ? ?Social History  ? ?Tobacco Use  ? Smoking status: Never  ? Smokeless tobacco: Never  ?Substance Use Topics  ? Alcohol use: Yes  ?  Alcohol/week: 2.0 - 3.0 standard drinks  ?  Types: 2 - 3 Glasses of wine per week  ?  Comment: 2-3 glasses of wine a week  ? ?Family History  ?Problem Relation Age of Onset  ? Hypertension Mother   ? Osteoarthritis Mother   ? Depression Mother   ? High Cholesterol Mother   ? Diabetes Father   ? Hypertension Father   ? Heart attack Father 61  ? Heart disease Father    ? High blood pressure Brother   ? Arthritis Maternal Grandmother   ? Stroke Maternal Grandfather   ? High blood pressure Paternal Grandmother   ? Hearing loss Paternal Grandmother   ? Hearing loss Paternal Grandfather   ? Heart attack Paternal Grandfather   ? Heart disease Paternal Grandfather   ? Esophageal cancer Neg Hx   ? Stomach cancer Neg Hx   ? Rectal cancer Neg Hx   ? Colon cancer Neg Hx   ? ? ? ?Review of Systems  ?Constitutional:  Negative for activity change, appetite change, chills, fatigue, fever and unexpected weight change.  ?HENT:  Negative for congestion, ear pain, hearing loss, sinus pressure, sinus pain, sore throat and trouble swallowing.   ?Eyes:  Negative for pain and visual disturbance.  ?Respiratory:  Negative for cough, chest tightness, shortness of breath and wheezing.   ?Cardiovascular:  Negative for chest pain, palpitations and leg swelling.  ?Gastrointestinal:  Negative for abdominal pain, blood in stool, constipation, diarrhea, nausea and vomiting.  ?Genitourinary:  Negative for difficulty urinating and menstrual problem.  ?Musculoskeletal:  Negative for arthralgias and back pain.  ?Skin:  Negative for rash.  ?     Lesion left forehead, enlarging in recent months  ?Neurological:  Negative for  dizziness, weakness, numbness and headaches.  ?Hematological:  Negative for adenopathy. Does not bruise/bleed easily.  ?Psychiatric/Behavioral:  Negative for sleep disturbance and suicidal ideas. The patient is not nervous/anxious.   ? ?CBC:  ?Lab Results  ?Component Value Date  ? WBC 6.6 08/06/2021  ? HGB 13.4 08/06/2021  ? HGB 13.4 11/27/2015  ? HCT 40.9 08/06/2021  ? MCH 27.8 11/27/2015  ? MCHC 32.8 08/06/2021  ? RDW 13.9 08/06/2021  ? PLT 291.0 08/06/2021  ? MPV 10.0 11/27/2015  ? ?CMP: ?Lab Results  ?Component Value Date  ? NA 140 08/06/2021  ? K 3.6 08/06/2021  ? CL 104 08/06/2021  ? CO2 26 08/06/2021  ? GLUCOSE 100 (H) 08/06/2021  ? BUN 10 08/06/2021  ? CREATININE 0.70 08/06/2021  ?  CREATININE 0.67 11/27/2015  ? CALCIUM 9.4 08/06/2021  ? PROT 7.6 08/06/2021  ? BILITOT 1.0 08/06/2021  ? ALKPHOS 79 08/06/2021  ? ALT 39 (H) 08/06/2021  ? AST 25 08/06/2021  ? ?LIPID: ?Lab Results  ?Component Value Date  ? CHOL 216 (H) 08/06/2021  ? TRIG 128.0 08/06/2021  ? HDL 58.50 08/06/2021  ? LDLCALC 132 (H) 08/06/2021  ? ? ?Objective: ? ?BP 120/78   Pulse 70   Temp 98.3 ?F (36.8 ?C) (Rectal)   Ht 5' 3.5" (1.613 m)   Wt 167 lb 3.2 oz (75.8 kg)   LMP 01/24/2008 (Approximate) Comment: spotting  SpO2 98%   BMI 29.15 kg/m?   Weight: 167 lb 3.2 oz (75.8 kg)  ? ?BP Readings from Last 3 Encounters:  ?02/07/22 120/78  ?12/01/21 130/88  ?09/27/21 130/80  ? ?Wt Readings from Last 3 Encounters:  ?02/07/22 167 lb 3.2 oz (75.8 kg)  ?12/01/21 163 lb (73.9 kg)  ?09/27/21 166 lb (75.3 kg)  ? ? ?Physical Exam ?Constitutional:   ?   General: She is not in acute distress. ?   Appearance: She is well-developed.  ?HENT:  ?   Head: Normocephalic and atraumatic.  ?   Right Ear: External ear normal.  ?   Left Ear: External ear normal.  ?   Mouth/Throat:  ?   Pharynx: No oropharyngeal exudate.  ?Eyes:  ?   Conjunctiva/sclera: Conjunctivae normal.  ?   Pupils: Pupils are equal, round, and reactive to light.  ?Neck:  ?   Thyroid: No thyromegaly.  ?Cardiovascular:  ?   Rate and Rhythm: Normal rate and regular rhythm.  ?   Heart sounds: Normal heart sounds. No murmur heard. ?  No friction rub. No gallop.  ?Pulmonary:  ?   Effort: Pulmonary effort is normal.  ?   Breath sounds: Normal breath sounds.  ?Abdominal:  ?   General: Bowel sounds are normal. There is no distension.  ?   Palpations: Abdomen is soft. There is no mass.  ?   Tenderness: There is no abdominal tenderness. There is no guarding.  ?   Hernia: No hernia is present.  ?Musculoskeletal:     ?   General: No tenderness or deformity. Normal range of motion.  ?   Cervical back: Normal range of motion and neck supple.  ?Lymphadenopathy:  ?   Cervical: No cervical adenopathy.   ?Skin: ?   General: Skin is warm and dry.  ?   Findings: No rash.  ?   Comments: There is 61m lesion left forehead, 25mcentral  ?Neurological:  ?   Mental Status: She is alert and oriented to person, place, and time.  ?   Deep Tendon  Reflexes: Reflexes normal.  ?   Reflex Scores: ?     Tricep reflexes are 2+ on the right side and 2+ on the left side. ?     Bicep reflexes are 2+ on the right side and 2+ on the left side. ?     Brachioradialis reflexes are 2+ on the right side and 2+ on the left side. ?     Patellar reflexes are 2+ on the right side and 2+ on the left side. ?Psychiatric:     ?   Speech: Speech normal.     ?   Behavior: Behavior normal.     ?   Thought Content: Thought content normal.  ? ? ?Assessment/Plan: ?Health Maintenance Due  ?Topic Date Due  ? Zoster Vaccines- Shingrix (2 of 2) 01/03/2020  ? ?Health Maintenance reviewed. ? ?1. Preventative health care ?Keep up with regular exercise.  ? ?2. Hypertension, unspecified type ?Well controlled! Continue with amlodipine '10mg'$  daily. ?- CBC with Differential/Platelet; Future ?- Comprehensive metabolic panel; Future ? ?3. Hyperlipidemia, unspecified hyperlipidemia type ?Last ascvd risk was 5%; will recheck for baseline. Continues to work on healthy eating and regular exercise. ?- Lipid panel; Future ? ?4. Hyperglycemia ?- Hemoglobin A1c; Future ? ?5. Skin lesion ?Concerned for squamous cell. Call if not heard from derm in 2 weeks time. ?- Ambulatory referral to Dermatology ? ? ? ?Return in about 6 months (around 08/09/2022). ? ?Micheline Rough, MD ? ? ? ? ? ?

## 2022-02-16 DIAGNOSIS — H2513 Age-related nuclear cataract, bilateral: Secondary | ICD-10-CM | POA: Diagnosis not present

## 2022-02-16 DIAGNOSIS — H5203 Hypermetropia, bilateral: Secondary | ICD-10-CM | POA: Diagnosis not present

## 2022-02-18 ENCOUNTER — Telehealth: Payer: Self-pay | Admitting: Family Medicine

## 2022-02-18 NOTE — Telephone Encounter (Signed)
Pt is returning Kaitlyn Garcia call concerning blood work results ?

## 2022-02-21 ENCOUNTER — Telehealth: Payer: Self-pay | Admitting: Family Medicine

## 2022-02-21 NOTE — Telephone Encounter (Signed)
Pt call and stated she she was talk with you and got cut off and would like for you to give her a call back. ?

## 2022-02-21 NOTE — Telephone Encounter (Signed)
See results note. 

## 2022-02-21 NOTE — Addendum Note (Signed)
Addended by: Agnes Lawrence on: 02/21/2022 10:17 AM ? ? Modules accepted: Orders ? ?

## 2022-04-11 ENCOUNTER — Ambulatory Visit
Admission: RE | Admit: 2022-04-11 | Discharge: 2022-04-11 | Disposition: A | Discharge status: Home / Self Care | Payer: Self-pay | Source: Ambulatory Visit | Attending: Family Medicine | Admitting: Family Medicine

## 2022-04-11 DIAGNOSIS — E785 Hyperlipidemia, unspecified: Secondary | ICD-10-CM

## 2022-04-22 ENCOUNTER — Other Ambulatory Visit: Payer: Self-pay | Admitting: Family

## 2022-04-22 DIAGNOSIS — I7121 Aneurysm of the ascending aorta, without rupture: Secondary | ICD-10-CM

## 2022-04-23 ENCOUNTER — Other Ambulatory Visit: Payer: Self-pay | Admitting: Family

## 2022-04-23 DIAGNOSIS — I7121 Aneurysm of the ascending aorta, without rupture: Secondary | ICD-10-CM

## 2022-05-19 DIAGNOSIS — C4441 Basal cell carcinoma of skin of scalp and neck: Secondary | ICD-10-CM | POA: Diagnosis not present

## 2022-05-19 DIAGNOSIS — C44319 Basal cell carcinoma of skin of other parts of face: Secondary | ICD-10-CM | POA: Diagnosis not present

## 2022-05-23 NOTE — Progress Notes (Unsigned)
[name]       [name] [address]             [phone]        PCP is Caren Macadam, MD (Inactive) Referring Provider is Kennyth Arnold, FNP  Chief Complaint: Ascending thoracic aortic dilatation   HPI: This is a 61 year old female with a past medical history of hypertension, high cholesterol, colonic adenoma, Factor V Leiden, and seasonal affective disorder who had cardiac CT on 04/11/2022. Results showed a calcium score of 0, a dilated main PA to >30 mm (suggestive of pulmonary hypertension), and was found to have an aneurysmal dilatation of the ascending thoracic aorta 4.1 cm. She presents today for discussion regarding aneurysmal dilatation of the ascending aorta. She denies chest pain, tightness or pressure or shortness of breath.  Past Medical History: Past Medical History:  Diagnosis Date   Adenoma    COVID 11/2020   High cholesterol    Hypertension    Polyp, colonic    Seasonal affective disorder ([place])    Past Surgical History: Past Surgical History:  Procedure Laterality Date   APPENDECTOMY  2000   CHOLECYSTECTOMY  2001   COLONOSCOPY     DILATION AND CURETTAGE OF UTERUS     INCISIONAL HERNIA REPAIR  2001   OOPHORECTOMY Left 2002   POLYPECTOMY     TONSILLECTOMY  1965   Family History: Family History  Problem Relation Age of Onset   Hypertension Mother    Osteoarthritis Mother    Depression Mother    High Cholesterol Mother    Diabetes Father    Hypertension Father    Heart attack Father 60   Heart disease Father    High blood pressure Brother    Arthritis Maternal Grandmother    Stroke Maternal Grandfather    High blood pressure Paternal Grandmother    Hearing loss Paternal Grandmother    Hearing loss Paternal Grandfather    Heart attack Paternal Grandfather    Heart disease Paternal Grandfather    Esophageal cancer Neg Hx    Stomach cancer Neg Hx    Rectal cancer Neg Hx    Colon cancer Neg Hx     Social  History: Social History   Tobacco Use   Smoking status: Never   Smokeless tobacco: Never  Vaping Use   Vaping Use: Never used  Substance Use Topics   Alcohol use: Yes    Alcohol/week: 2.0 - 3.0 standard drinks of alcohol    Types: 2 - 3 Glasses of wine per week    Comment: 2-3 glasses of wine a week   Drug use: No    Current Outpatient Medications  Medication Sig Dispense Refill   amLODipine (NORVASC) 10 MG tablet TAKE 1 TABLET BY MOUTH EVERY DAY 90 tablet 1   MAGNESIUM PO Take by mouth daily.     Multiple Vitamin (MULTIVITAMIN) capsule Take 1 capsule by mouth daily.     Current Facility-Administered Medications  Medication Dose Route Frequency Provider Last Rate Last Admin   0.9 %  sodium chloride infusion  500 mL Intravenous Once Pyrtle, Lajuan Lines, MD       Allergies: No Known Allergies  Review of Systems:  Cardiac Review of Systems: Y or  [  N  ]= no             Chest Pain [  N  ]        Exertional  SOB  Aqua.Slicker  ]  Pedal Edema [  N ]               Palpitations [ N ]         Syncope  [ N ]                          General Review of Systems: [Y] = yes [ N ]=no Constitional: nausea [ N ]; night sweats [  N]; fever [ N ]; or chills [ N                                                                            Eye :  Amaurosis fugax[  N]; Resp: wheezing[ N ];  hemoptysis[ N ];  GI: vomiting[ N ];  melena[ N ];  hematochezia Aqua.Slicker  ];   GU:  hematuria[ N ];                Skin: rash, swelling[ N ];,              Neuro: TIA[ N ];   stroke[N  ];  seizures[ N ];                 Endocrine: diabetes[  N];  thyroid dysfunction[ N ];  LMP 01/24/2008 (Approximate) Comment: spotting Vitals:   05/24/22 1434  BP: (!) 152/91  Pulse: 93  Resp: 18  SpO2: 97%    Physical Exam: CV-RRR, no murmur Pulmonary-Clear to auscultation bilaterally Abdomen-Soft, non tender, bowel sounds present Extremities-Palpable CP/PT bilaterally, no LE edema Neurologic-Grossly intact without focal  deficit  Diagnostic Tests: CLINICAL DATA:  Cardiovascular Disease Risk stratification   EXAM: Coronary Calcium Score   TECHNIQUE: A gated, non-contrast computed tomography scan of the heart was performed using 89m slice thickness. Axial images were analyzed on a dedicated workstation. Calcium scoring of the coronary arteries was performed using the Agatston method.   FINDINGS: Coronary arteries: Normal origins.   Coronary Calcium Score:   Left main: 0   Left anterior descending artery: 0   Left circumflex artery: 0   Right coronary artery: 0   Total: 0   Percentile: 0   Pericardium: Normal.   Aorta: DIlated to 41 mm at the main PA bifurcation.   Dilated main PA to >30 mm, suggestive of pulmonary hypertension.   Non-cardiac: [facility]   IMPRESSION: 1. Coronary calcium score of 0. 2. Aorta: DIlated to 41 mm at the main PA bifurcation. 3. Dilated main PA to >30 mm, suggestive of pulmonary hypertension. EXAM: OVER-READ INTERPRETATION  CT CHEST   The following report is a limited chest CT over-read performed by radiologist Dr. MLavonia Danaof [facility] [place] 04/11/2022. The coronary calcium score interpretation by the cardiologist is attached.   COMPARISON:  None Available.   FINDINGS: Vascular: Aneurysmal dilatation ascending thoracic aorta 4.1 cm transverse image 16. No pericardial effusion.   Mediastinum/Nodes: Esophagus unremarkable.  No adenopathy.   Lungs/Pleura: Visualized lungs clear. No pulmonary infiltrate, pleural effusion, or pneumothorax.   Upper Abdomen: Small cyst superiorly within liver 11 mm diameter image 65. Remaining visualized upper abdomen unremarkable.  Musculoskeletal: No osseous abnormalities.   IMPRESSION: Aneurysmal dilatation of the ascending thoracic aorta 4.1 cm transverse; Recommend annual imaging followup by CTA or MRA. This recommendation follows  2010 ACCF/AHA/AATS/ACR/ASA/SCA/SCAI/SIR/STS/SVM Guidelines for the Diagnosis and Management of Patients with Thoracic Aortic Disease. Circulation. 2010; 121: L579-J282. Aortic aneurysm NOS (ICD10-I71.9).   No additional intrathoracic abnormalities.   Electronically Signed: By: Lavonia Dana M.D. On: 04/11/2022 16:30  Impression and Plan: We reviewed the findings on cardiac CT regarding the aneurysmal dilatation of the ascending aorta of 4.1 cm and that she does not need surgery at this time. Looking at past blood pressures, she has been well controlled. She has had some extra stress with moving recently and was anxious about this visit. She had a BP cuff and will monitor BP. We had a discussion of the risk modifications regarding this aneurysm as well as the need for yearly surveillance.  We will obtain yearly imaging and she will return for further surveillance in one year.   Nani Skillern, PA-C [facility] [phone]

## 2022-05-24 ENCOUNTER — Institutional Professional Consult (permissible substitution): Payer: BC Managed Care – PPO | Admitting: Physician Assistant

## 2022-05-24 VITALS — BP 152/91 | HR 93 | Resp 18 | Ht 63.5 in | Wt 163.0 lb

## 2022-05-24 DIAGNOSIS — I712 Thoracic aortic aneurysm, without rupture, unspecified: Secondary | ICD-10-CM

## 2022-05-24 NOTE — Patient Instructions (Addendum)
  Risk Modification in those with ascending thoracic aortic aneurysm:  I stressed the importance of good control of blood pressure (prefer SBP 130/80 or less)-continue with Amlodipine 10 mg daily. Will follow up with medical doctor  2. Avoid fluoroquinolone antibiotics (I.e Ciprofloxacin, Avelox, Levofloxacin, Ofloxacin)  3.  Use of statin (to decrease cardiovascular risk)-Lipid panel obtained in April 2023 showed cholesterol to be 232, LDL 141, and Triglycerides 93. Will defer to primary for initiation of statin  4.  Exercise and activity limitations is individualized, but in general, contact sports are to be avoided and one should avoid heavy lifting (defined as half of ideal body weight) and exercises involving sustained Valsalva maneuver.  5. Counseling for those suspected of having genetically mediated disease. First-degree relatives of those with TAA disease should be screened as well as those who have a connective tissue disease (I.e with Marfan syndrome, Ehlers-Danlos syndrome, and Loeys-Dietz or Turner syndrome) or a bicuspid aortic valve,have an increased risk for complications related to TAA. She has no history of above  6. She has no of tobacco abuse

## 2022-06-29 DIAGNOSIS — C44319 Basal cell carcinoma of skin of other parts of face: Secondary | ICD-10-CM | POA: Diagnosis not present

## 2022-08-15 ENCOUNTER — Other Ambulatory Visit: Payer: Self-pay | Admitting: *Deleted

## 2022-08-15 DIAGNOSIS — I10 Essential (primary) hypertension: Secondary | ICD-10-CM

## 2022-08-15 MED ORDER — AMLODIPINE BESYLATE 10 MG PO TABS: 10.0000 mg | ORAL_TABLET | Freq: Every day | ORAL | 0 refills | Status: DC

## 2022-09-14 DIAGNOSIS — Z6828 Body mass index (BMI) 28.0-28.9, adult: Secondary | ICD-10-CM | POA: Diagnosis not present

## 2022-09-14 DIAGNOSIS — H66003 Acute suppurative otitis media without spontaneous rupture of ear drum, bilateral: Secondary | ICD-10-CM | POA: Diagnosis not present

## 2022-09-24 DIAGNOSIS — R509 Fever, unspecified: Secondary | ICD-10-CM | POA: Diagnosis not present

## 2022-09-24 DIAGNOSIS — Z03818 Encounter for observation for suspected exposure to other biological agents ruled out: Secondary | ICD-10-CM | POA: Diagnosis not present

## 2022-09-24 DIAGNOSIS — R059 Cough, unspecified: Secondary | ICD-10-CM | POA: Diagnosis not present

## 2022-09-24 DIAGNOSIS — R5383 Other fatigue: Secondary | ICD-10-CM | POA: Diagnosis not present

## 2022-10-04 ENCOUNTER — Ambulatory Visit: Payer: BC Managed Care – PPO | Admitting: Family Medicine

## 2022-10-04 ENCOUNTER — Encounter: Payer: Self-pay | Admitting: Family Medicine

## 2022-10-04 VITALS — BP 144/90 | HR 95 | Temp 98.2°F | Ht 63.5 in | Wt 168.4 lb

## 2022-10-04 DIAGNOSIS — J209 Acute bronchitis, unspecified: Secondary | ICD-10-CM | POA: Diagnosis not present

## 2022-10-04 DIAGNOSIS — J189 Pneumonia, unspecified organism: Secondary | ICD-10-CM

## 2022-10-04 MED ORDER — PREDNISONE 20 MG PO TABS
ORAL_TABLET | ORAL | 0 refills | Status: AC
Start: 2022-10-04 — End: 2022-10-16

## 2022-10-04 MED ORDER — ALBUTEROL SULFATE HFA 108 (90 BASE) MCG/ACT IN AERS: 2.0000 | INHALATION_SPRAY | Freq: Four times a day (QID) | RESPIRATORY_TRACT | 0 refills | Status: DC | PRN

## 2022-10-04 NOTE — Progress Notes (Signed)
Established Patient Office Visit  Subjective   Patient ID: Kaitlyn Garcia, female    DOB: [birth year]  Age: 61 y.o. MRN: [id number]  Chief Complaint  Patient presents with   Follow-up    Patient states she was seen at [facility] on December 2nd due to severe non-productive cough x3 weeks, fever and states she was diagnosed with pneumonia    Pt reports that she had some intermittent coughing that was going on for 3 weeks, states that she was treated with augmentin for 2 rounds, states that she is getting some better gradually, states that she feels like she should be getting better more rapidly. States that she was tested at the time for COVID, flu and RSV, all were negative. States that she feels like she is wheezing some in her chest.  States that she has had COVID infection in the past but they were relatively mild cases.    Current Outpatient Medications  Medication Instructions   albuterol (VENTOLIN HFA) 108 (90 Base) MCG/ACT inhaler 2 puffs, Inhalation, Every 6 hours PRN   amLODipine (NORVASC) 10 mg, Oral, Daily   amoxicillin-clavulanate (AUGMENTIN) 875-125 MG tablet SMARTSIG:1 Tablet(s) By Mouth Every 12 Hours   MAGNESIUM PO Oral, Daily   Multiple Vitamin (MULTIVITAMIN) capsule 1 capsule, Oral, Daily   predniSONE (DELTASONE) 20 MG tablet Take 3 tablets (60 mg total) by mouth daily with breakfast for 3 days, THEN 2 tablets (40 mg total) daily with breakfast for 3 days, THEN 1 tablet (20 mg total) daily with breakfast for 3 days, THEN 0.5 tablets (10 mg total) daily with breakfast for 3 days.     Patient Active Problem List   Diagnosis Date Noted   Bursitis of left hip 08/16/2021   Heterozygous factor V Leiden mutation ([place]) 07/21/2019   Hypertension 08/13/2018      Review of Systems  All other systems reviewed and are negative.     Objective:     BP (!) 144/90 (BP Location: Left Arm, Patient Position: Sitting, Cuff Size: Normal)   Pulse 95   Temp 98.2 F (36.8  C) (Oral)   Ht 5' 3.5" (1.613 m)   Wt 168 lb 6.4 oz (76.4 kg)   LMP 01/24/2008 (Approximate) Comment: spotting  SpO2 99%   BMI 29.36 kg/m    Physical Exam Vitals reviewed.  Constitutional:      Appearance: Normal appearance. She is well-groomed and normal weight.  Eyes:     Conjunctiva/sclera: Conjunctivae normal.  Neck:     Thyroid: No thyromegaly.  Cardiovascular:     Rate and Rhythm: Normal rate and regular rhythm.     Pulses: Normal pulses.     Heart sounds: S1 normal and S2 normal.  Pulmonary:     Effort: Pulmonary effort is normal.     Breath sounds: Normal air entry. Wheezing (diffuse inspiratory and expiratory in all lung fields) present.  Musculoskeletal:     Right lower leg: No edema.     Left lower leg: No edema.  Neurological:     Mental Status: She is alert and oriented to person, place, and time. Mental status is at baseline.     Gait: Gait is intact.  Psychiatric:        Mood and Affect: Mood and affect normal.        Speech: Speech normal.        Behavior: Behavior normal.        Judgment: Judgment normal.  No results found for any visits on 10/04/22.    The 10-year ASCVD risk score (Arnett DK, et al., 2019) is: 7.3%    Assessment & Plan:   Problem List Items Addressed This Visit   None Visit Diagnoses     Acute bronchitis, unspecified organism    -  Primary   Relevant Medications   Significant persistent wheezing in all lung fields. Recommend starting prednisone 60 mg daily and tapering the dose by 20 mg every 3 days until gone. I am also ordering her a albuterol inhaler, 2 puffs every 6 hours as needed for cough and SOB. Will get repeat chest film in 6 weeks. Patient did bring a copy of the radiology report from her CXR at [facility], it showed a possible pnuemonia in the left lung vs. [place].   predniSONE (DELTASONE) 20 MG tablet   albuterol (VENTOLIN HFA) 108 (90 Base) MCG/ACT inhaler   Pneumonia of left lung due to infectious organism,  unspecified part of lung       Relevant Medications   Pt reports she is almost finished with the augmentin round. I do not think she needs any additional antibiotics at this time.  amoxicillin-clavulanate (AUGMENTIN) 875-125 MG tablet   albuterol (VENTOLIN HFA) 108 (90 Base) MCG/ACT inhaler   Other Relevant Orders   DG Chest 2 View       Return in about 4 months (around 02/09/2023) for Annual Physical Exam.    Farrel Conners, MD

## 2022-10-19 ENCOUNTER — Encounter: Payer: Self-pay | Admitting: Family Medicine

## 2022-10-28 ENCOUNTER — Ambulatory Visit (INDEPENDENT_AMBULATORY_CARE_PROVIDER_SITE_OTHER): Payer: BC Managed Care – PPO | Admitting: Family Medicine

## 2022-10-28 ENCOUNTER — Encounter: Payer: Self-pay | Admitting: Family Medicine

## 2022-10-28 VITALS — BP 124/80 | HR 90 | Temp 98.2°F | Ht 63.5 in | Wt 170.5 lb

## 2022-10-28 DIAGNOSIS — H66003 Acute suppurative otitis media without spontaneous rupture of ear drum, bilateral: Secondary | ICD-10-CM

## 2022-10-28 MED ORDER — FLUTICASONE PROPIONATE 50 MCG/ACT NA SUSP: 2.0000 | Freq: Every day | NASAL | 1 refills | Status: DC

## 2022-10-28 MED ORDER — CEFDINIR 300 MG PO CAPS: 300.0000 mg | ORAL_CAPSULE | Freq: Two times a day (BID) | ORAL | 0 refills | Status: AC

## 2022-10-28 NOTE — Progress Notes (Signed)
   Established Patient Office Visit  Subjective   Patient ID: Kaitlyn Garcia, female    DOB: January 04, 1961  Age: 62 y.o. MRN: [id number]  Chief Complaint  Patient presents with   Ear Fullness    Patient complains of bilateral ear congestion, left greater than right for the past 2 weeks    Pt is reporting a 2 week history of ear fullness, is not able to pop her ears. Has tried chewing gum and different methods. States her hearing is muffled on both sides. No pain in the ear sometimes she is getting some tenderness behind the ear. No fever/chills. Was seen on 12/12 and was given antibiotics at that time, the congestion started after the finished the steroids and abx.   Ear Fullness  There is pain in both ears. This is a recurrent problem. The current episode started 1 to 4 weeks ago.      Review of Systems  All other systems reviewed and are negative.     Objective:     BP 124/80 (BP Location: Left Arm, Patient Position: Sitting, Cuff Size: Normal)   Pulse 90   Temp 98.2 F (36.8 C) (Oral)   Ht 5' 3.5" (1.613 m)   Wt 170 lb 8 oz (77.3 kg)   LMP 01/24/2008 (Approximate) Comment: spotting  SpO2 99%   BMI 29.73 kg/m    Physical Exam Vitals reviewed.  Constitutional:      Appearance: Normal appearance. She is well-groomed and normal weight.  HENT:     Right Ear: Tympanic membrane is injected and bulging.     Left Ear: Tympanic membrane is injected and bulging.  Cardiovascular:     Rate and Rhythm: Normal rate and regular rhythm.     Pulses: Normal pulses.     Heart sounds: S1 normal and S2 normal.  Pulmonary:     Effort: Pulmonary effort is normal.     Breath sounds: Normal breath sounds and air entry.  Lymphadenopathy:     Cervical: No cervical adenopathy.  Neurological:     Mental Status: She is alert and oriented to person, place, and time. Mental status is at baseline.     Gait: Gait is intact.  Psychiatric:        Mood and Affect: Mood and affect normal.         Speech: Speech normal.        Behavior: Behavior normal.        Judgment: Judgment normal.      No results found for any visits on 10/28/22.    The 10-year ASCVD risk score (Arnett DK, et al., 2019) is: 5.4%    Assessment & Plan:   Problem List Items Addressed This Visit   None Visit Diagnoses     Non-recurrent acute suppurative otitis media of both ears without spontaneous rupture of tympanic membranes    -  Primary   Relevant Medications   Both ears involved, will treat with cefdinir 300 mg BID for 7 days and add flonase 1 spray per nostril daily to help with the mucus production cefdinir (OMNICEF) 300 MG capsule   fluticasone (FLONASE) 50 MCG/ACT nasal spray       No follow-ups on file.    Farrel Conners, MD

## 2022-11-09 ENCOUNTER — Other Ambulatory Visit: Payer: Self-pay | Admitting: Family Medicine

## 2022-11-09 ENCOUNTER — Encounter: Payer: BC Managed Care – PPO | Admitting: Family Medicine

## 2022-11-09 DIAGNOSIS — I10 Essential (primary) hypertension: Secondary | ICD-10-CM

## 2022-11-11 ENCOUNTER — Other Ambulatory Visit: Payer: BC Managed Care – PPO

## 2022-11-19 ENCOUNTER — Other Ambulatory Visit: Payer: Self-pay | Admitting: Family Medicine

## 2022-11-19 DIAGNOSIS — H66003 Acute suppurative otitis media without spontaneous rupture of ear drum, bilateral: Secondary | ICD-10-CM

## 2022-11-28 ENCOUNTER — Ambulatory Visit (INDEPENDENT_AMBULATORY_CARE_PROVIDER_SITE_OTHER)
Admission: RE | Admit: 2022-11-28 | Discharge: 2022-11-28 | Disposition: A | Discharge status: Home / Self Care | Payer: BC Managed Care – PPO | Source: Ambulatory Visit | Attending: Family Medicine | Admitting: Family Medicine

## 2022-11-28 DIAGNOSIS — J189 Pneumonia, unspecified organism: Secondary | ICD-10-CM

## 2022-12-14 DIAGNOSIS — D2271 Melanocytic nevi of right lower limb, including hip: Secondary | ICD-10-CM | POA: Diagnosis not present

## 2022-12-14 DIAGNOSIS — D2262 Melanocytic nevi of left upper limb, including shoulder: Secondary | ICD-10-CM | POA: Diagnosis not present

## 2022-12-14 DIAGNOSIS — L570 Actinic keratosis: Secondary | ICD-10-CM | POA: Diagnosis not present

## 2022-12-14 DIAGNOSIS — Z85828 Personal history of other malignant neoplasm of skin: Secondary | ICD-10-CM | POA: Diagnosis not present

## 2022-12-14 DIAGNOSIS — L821 Other seborrheic keratosis: Secondary | ICD-10-CM | POA: Diagnosis not present

## 2023-02-10 ENCOUNTER — Ambulatory Visit (INDEPENDENT_AMBULATORY_CARE_PROVIDER_SITE_OTHER): Payer: BC Managed Care – PPO | Admitting: Family Medicine

## 2023-02-10 ENCOUNTER — Encounter: Payer: Self-pay | Admitting: Family Medicine

## 2023-02-10 VITALS — BP 134/83 | HR 85 | Temp 98.2°F | Ht 64.0 in | Wt 167.5 lb

## 2023-02-10 DIAGNOSIS — Z1231 Encounter for screening mammogram for malignant neoplasm of breast: Secondary | ICD-10-CM

## 2023-02-10 DIAGNOSIS — Z0000 Encounter for general adult medical examination without abnormal findings: Secondary | ICD-10-CM

## 2023-02-10 DIAGNOSIS — I7121 Aneurysm of the ascending aorta, without rupture: Secondary | ICD-10-CM

## 2023-02-10 DIAGNOSIS — I10 Essential (primary) hypertension: Secondary | ICD-10-CM | POA: Diagnosis not present

## 2023-02-10 DIAGNOSIS — R739 Hyperglycemia, unspecified: Secondary | ICD-10-CM | POA: Diagnosis not present

## 2023-02-10 LAB — HEMOGLOBIN A1C: Hgb A1c MFr Bld: 5.7 % (ref 4.6–6.5)

## 2023-02-10 LAB — LIPID PANEL
Cholesterol: 219 mg/dL — ABNORMAL HIGH (ref 0–200)
HDL: 58.4 mg/dL (ref >39.00)
LDL Cholesterol: 144 mg/dL — ABNORMAL HIGH (ref 0–99)
NonHDL: 160.66
Total CHOL/HDL Ratio: 4
Triglycerides: 84 mg/dL (ref 0.0–149.0)
VLDL: 16.8 mg/dL (ref 0.0–40.0)

## 2023-02-10 LAB — CBC WITH DIFFERENTIAL/PLATELET
Basophils Absolute: 0.1 10*3/uL (ref 0.0–0.1)
Basophils Relative: 0.8 % (ref 0.0–3.0)
Eosinophils Absolute: 0.2 10*3/uL (ref 0.0–0.7)
Eosinophils Relative: 3.3 % (ref 0.0–5.0)
HCT: 41.7 % (ref 36.0–46.0)
Hemoglobin: 14.2 g/dL (ref 12.0–15.0)
Lymphocytes Relative: 33.2 % (ref 12.0–46.0)
Lymphs Abs: 2.4 10*3/uL (ref 0.7–4.0)
MCHC: 34 g/dL (ref 30.0–36.0)
MCV: 82.3 fl (ref 78.0–100.0)
Monocytes Absolute: 0.5 10*3/uL (ref 0.1–1.0)
Monocytes Relative: 7.5 % (ref 3.0–12.0)
Neutro Abs: 4 10*3/uL (ref 1.4–7.7)
Neutrophils Relative %: 55.2 % (ref 43.0–77.0)
Platelets: 281 10*3/uL (ref 150.0–400.0)
RBC: 5.07 Mil/uL (ref 3.87–5.11)
RDW: 14.7 % (ref 11.5–15.5)
WBC: 7.2 10*3/uL (ref 4.0–10.5)

## 2023-02-10 LAB — COMPREHENSIVE METABOLIC PANEL
ALT: 35 U/L (ref 0–35)
AST: 28 U/L (ref 0–37)
Albumin: 4.6 g/dL (ref 3.5–5.2)
Alkaline Phosphatase: 89 U/L (ref 39–117)
BUN: 14 mg/dL (ref 6–23)
CO2: 26 mEq/L (ref 19–32)
Calcium: 9.4 mg/dL (ref 8.4–10.5)
Chloride: 105 mEq/L (ref 96–112)
Creatinine, Ser: 0.77 mg/dL (ref 0.40–1.20)
GFR: 81.73 mL/min (ref >60.00)
Glucose, Bld: 110 mg/dL — ABNORMAL HIGH (ref 70–99)
Potassium: 3.7 mEq/L (ref 3.5–5.1)
Sodium: 140 mEq/L (ref 135–145)
Total Bilirubin: 0.8 mg/dL (ref 0.2–1.2)
Total Protein: 7.6 g/dL (ref 6.0–8.3)

## 2023-02-10 LAB — TSH: TSH: 3.58 u[IU]/mL (ref 0.35–5.50)

## 2023-02-10 NOTE — Patient Instructions (Signed)

## 2023-02-10 NOTE — Progress Notes (Signed)
Complete physical exam  Patient: Kaitlyn Garcia   DOB: [birth year]   62 y.o. Female  MRN: [id number]  Subjective:    Chief Complaint  Patient presents with   Annual Exam    Kaitlyn Garcia is a 62 y.o. female who presents today for a complete physical exam. She reports consuming a general diet. Home exercise routine includes walking 4 hrs per week. She generally feels well. She reports sleeping well. She does not have additional problems to discuss today.    Most recent fall risk assessment:     No data to display           Most recent depression screenings:    02/10/2023    8:00 AM 02/07/2022    9:53 AM  PHQ 2/9 Scores  PHQ - 2 Score 0 0  PHQ- 9 Score  1    Vision:Within last year and Dental: No current dental problems and Receives regular dental care  Patient Active Problem List   Diagnosis Date Noted   Bursitis of left hip 08/16/2021   Heterozygous factor V Leiden mutation 07/21/2019   Hypertension 08/13/2018      Patient Care Team: Karie Georges, MD as PCP - General (Family Medicine) Ardell Isaacs, Forrestine Him, MD as Consulting Physician (Obstetrics and Gynecology) Antony Contras, MD as Consulting Physician (Ophthalmology)   Outpatient Medications Prior to Visit  Medication Sig   albuterol (VENTOLIN HFA) 108 (90 Base) MCG/ACT inhaler Inhale 2 puffs into the lungs every 6 (six) hours as needed for wheezing or shortness of breath.   amLODipine (NORVASC) 10 MG tablet TAKE 1 TABLET BY MOUTH EVERY DAY   Cholecalciferol (VITAMIN D-3 PO) Take 2,000 Units by mouth daily.   Cyanocobalamin (B-12 PO) Take by mouth daily.   fluticasone (FLONASE) 50 MCG/ACT nasal spray SPRAY 2 SPRAYS INTO EACH NOSTRIL EVERY DAY   MAGNESIUM PO Take by mouth daily.   Multiple Vitamin (MULTIVITAMIN) capsule Take 1 capsule by mouth daily.   Facility-Administered Medications Prior to Visit  Medication Dose Route Frequency Provider   0.9 %  sodium chloride infusion  500 mL Intravenous  Once Pyrtle, Carie Caddy, MD    Review of Systems  HENT:  Negative for hearing loss.   Eyes:  Negative for blurred vision.  Respiratory:  Negative for shortness of breath.   Cardiovascular:  Negative for chest pain.  Gastrointestinal: Negative.   Genitourinary: Negative.   Musculoskeletal:  Negative for back pain.  Neurological:  Negative for headaches.  Psychiatric/Behavioral:  Negative for depression.   All other systems reviewed and are negative.         Objective:     BP 134/83 (BP Location: Right Arm, Patient Position: Sitting, Cuff Size: Normal)   Pulse 85   Temp 98.2 F (36.8 C) (Oral)   Ht  (1.626 m)   Wt 167 lb 8 oz (76 kg)   LMP 01/24/2008 (Approximate) Comment: spotting  SpO2 98%   BMI 28.75 kg/m  BP Readings from Last 3 Encounters:  02/10/23 134/83  10/28/22 124/80  10/04/22 (!) 144/90      Physical Exam Vitals reviewed.  Constitutional:      Appearance: Normal appearance. She is well-groomed and normal weight.  HENT:     Right Ear: Tympanic membrane normal.     Left Ear: Tympanic membrane normal.     Mouth/Throat:     Mouth: Mucous membranes are moist.     Pharynx: No posterior oropharyngeal erythema.  Eyes:  Conjunctiva/sclera: Conjunctivae normal.  Neck:     Thyroid: No thyromegaly.  Cardiovascular:     Rate and Rhythm: Normal rate and regular rhythm.     Pulses: Normal pulses.     Heart sounds: S1 normal and S2 normal.  Pulmonary:     Effort: Pulmonary effort is normal.     Breath sounds: Normal breath sounds and air entry.  Abdominal:     General: Bowel sounds are normal.  Musculoskeletal:     Right lower leg: No edema.     Left lower leg: No edema.  Lymphadenopathy:     Cervical: No cervical adenopathy.  Neurological:     Mental Status: She is alert and oriented to person, place, and time. Mental status is at baseline.     Gait: Gait is intact.  Psychiatric:        Mood and Affect: Mood and affect normal.        Speech:  Speech normal.        Behavior: Behavior normal.        Judgment: Judgment normal.      No results found for any visits on 02/10/23.     Assessment & Plan:    Routine Health Maintenance and Physical Exam  Immunization History  Administered Date(s) Administered   Influenza,inj,Quad PF,6+ Mos 08/13/2018, 07/22/2019   PFIZER(Purple Top)SARS-COV-2 Vaccination 02/15/2020, 03/16/2020, 10/24/2020   Tdap 11/08/2013   Zoster Recombinat (Shingrix) 11/08/2019    Health Maintenance  Topic Date Due   Zoster Vaccines- Shingrix (2 of 2) 01/03/2020   MAMMOGRAM  02/01/2022   COVID-19 Vaccine (4 - 2023-24 season) 02/10/2024 (Originally 06/24/2022)   INFLUENZA VACCINE  05/25/2023   COLONOSCOPY (Pts 45-6yrs Insurance coverage will need to be confirmed)  10/12/2023   DTaP/Tdap/Td (2 - Td or Tdap) 11/09/2023   PAP SMEAR-Modifier  02/08/2024   Hepatitis C Screening  Completed   HIV Screening  Completed   HPV VACCINES  Aged Out    Discussed health benefits of physical activity, and encouraged her to engage in regular exercise appropriate for her age and condition.  Problem List Items Addressed This Visit       Unprioritized   Hypertension - Primary   Relevant Orders   CMP   Lipid Panel   TSH   Other Visit Diagnoses     Preventative health care       Relevant Orders   Normal physical exam findings today. Repeat BP testing showed 134/83, well controlled. I have ordered her annual labs plus mammogram and CT chest angio for yearly surveillance of her aortic aneurysm (4.1 cm last year). Handouts given on healthy exercise and eating.  CBC with Differential/Platelets   Hyperglycemia       Relevant Orders   Hemoglobin A1c   Breast cancer screening by mammogram       Relevant Orders   MM 3D SCREENING MAMMOGRAM BILATERAL BREAST   Aneurysm of ascending aorta without rupture       Relevant Orders   CT ANGIO CHEST AORTA W/CM & OR WO/CM      Return in 6 months (on 08/12/2023).      Karie Georges, MD

## 2023-02-17 DIAGNOSIS — J069 Acute upper respiratory infection, unspecified: Secondary | ICD-10-CM | POA: Diagnosis not present

## 2023-02-17 DIAGNOSIS — H66001 Acute suppurative otitis media without spontaneous rupture of ear drum, right ear: Secondary | ICD-10-CM | POA: Diagnosis not present

## 2023-02-27 DIAGNOSIS — H5203 Hypermetropia, bilateral: Secondary | ICD-10-CM | POA: Diagnosis not present

## 2023-02-27 DIAGNOSIS — H524 Presbyopia: Secondary | ICD-10-CM | POA: Diagnosis not present

## 2023-02-27 DIAGNOSIS — H2513 Age-related nuclear cataract, bilateral: Secondary | ICD-10-CM | POA: Diagnosis not present

## 2023-03-28 ENCOUNTER — Ambulatory Visit
Admission: RE | Admit: 2023-03-28 | Discharge: 2023-03-28 | Disposition: A | Discharge status: Home / Self Care | Payer: BC Managed Care – PPO | Source: Ambulatory Visit | Attending: Family Medicine | Admitting: Family Medicine

## 2023-03-28 DIAGNOSIS — Z1231 Encounter for screening mammogram for malignant neoplasm of breast: Secondary | ICD-10-CM | POA: Diagnosis not present

## 2023-04-12 IMAGING — DX DG HIP (WITH OR WITHOUT PELVIS) 2-3V*L*
3 series · 3 of 3 positions shown · non-contrast
Comparison: None.

CLINICAL DATA: Intermittent hip pain for 2 years

EXAM:
DG HIP (WITH OR WITHOUT PELVIS) 2-3V LEFT

[pelvis ap]
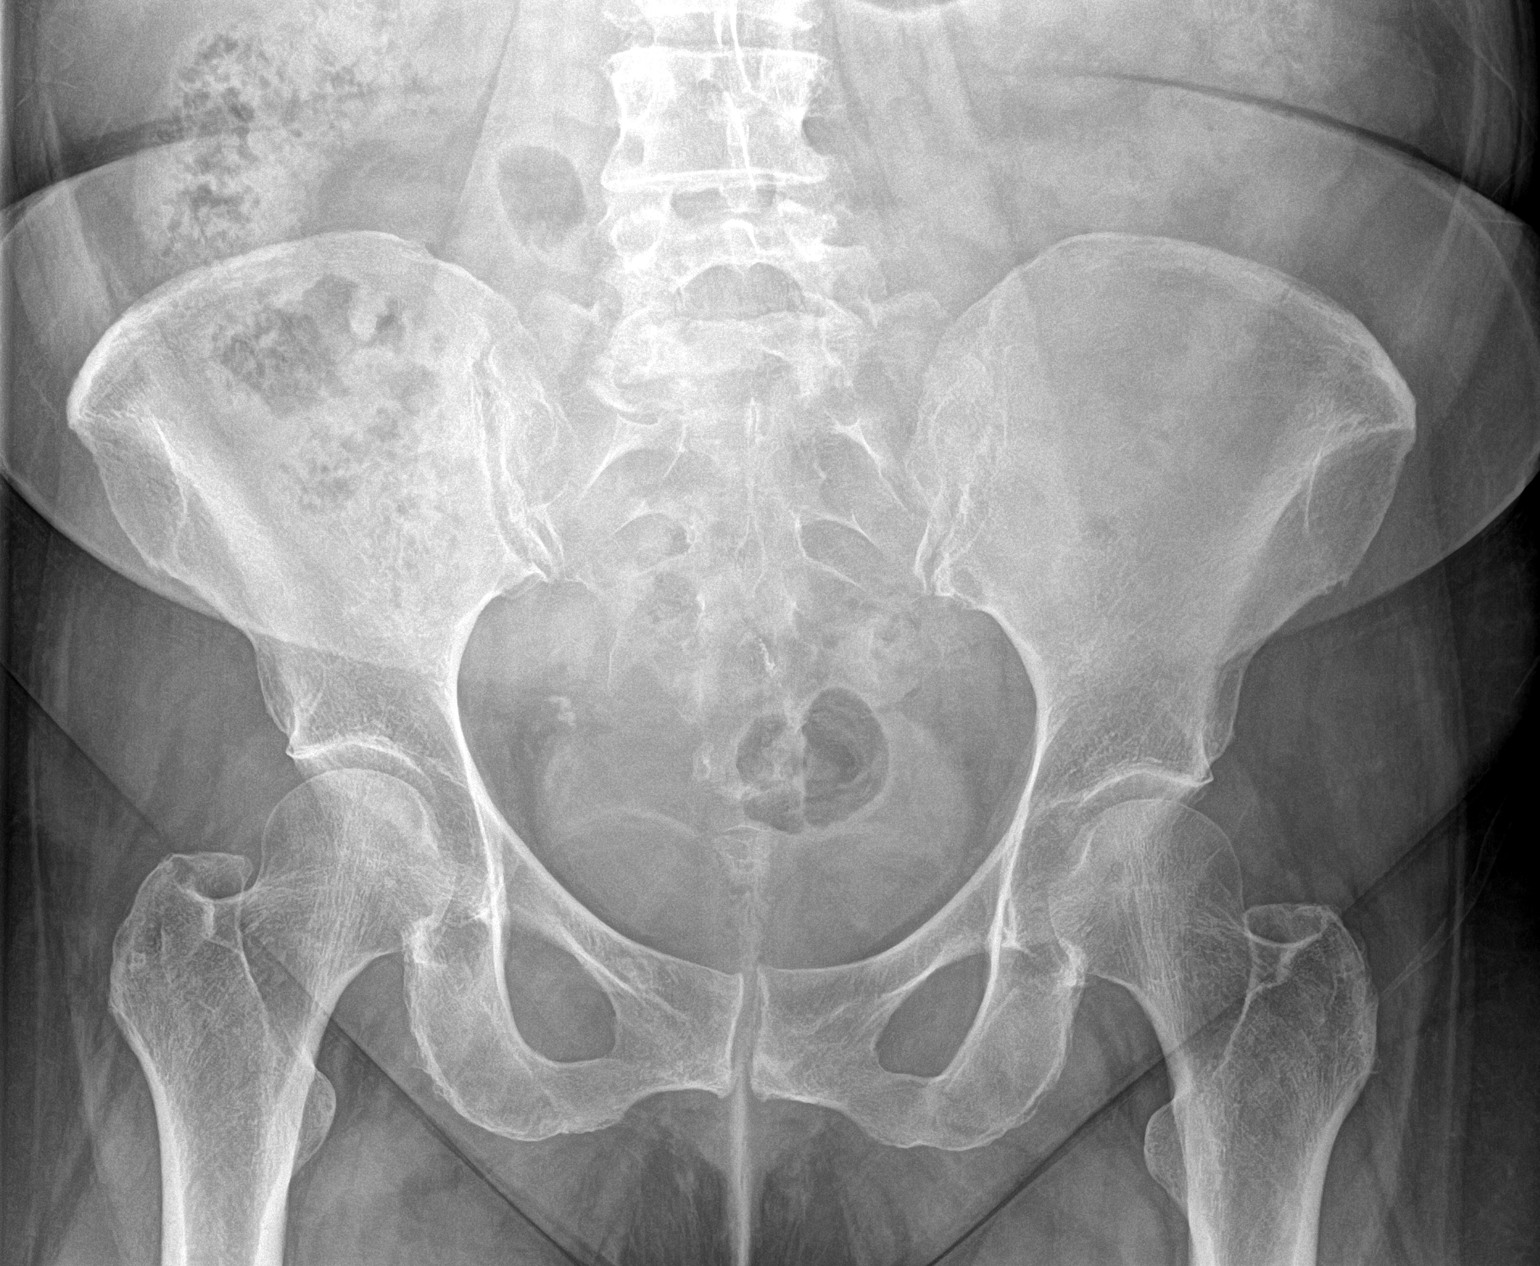

[hip ap]
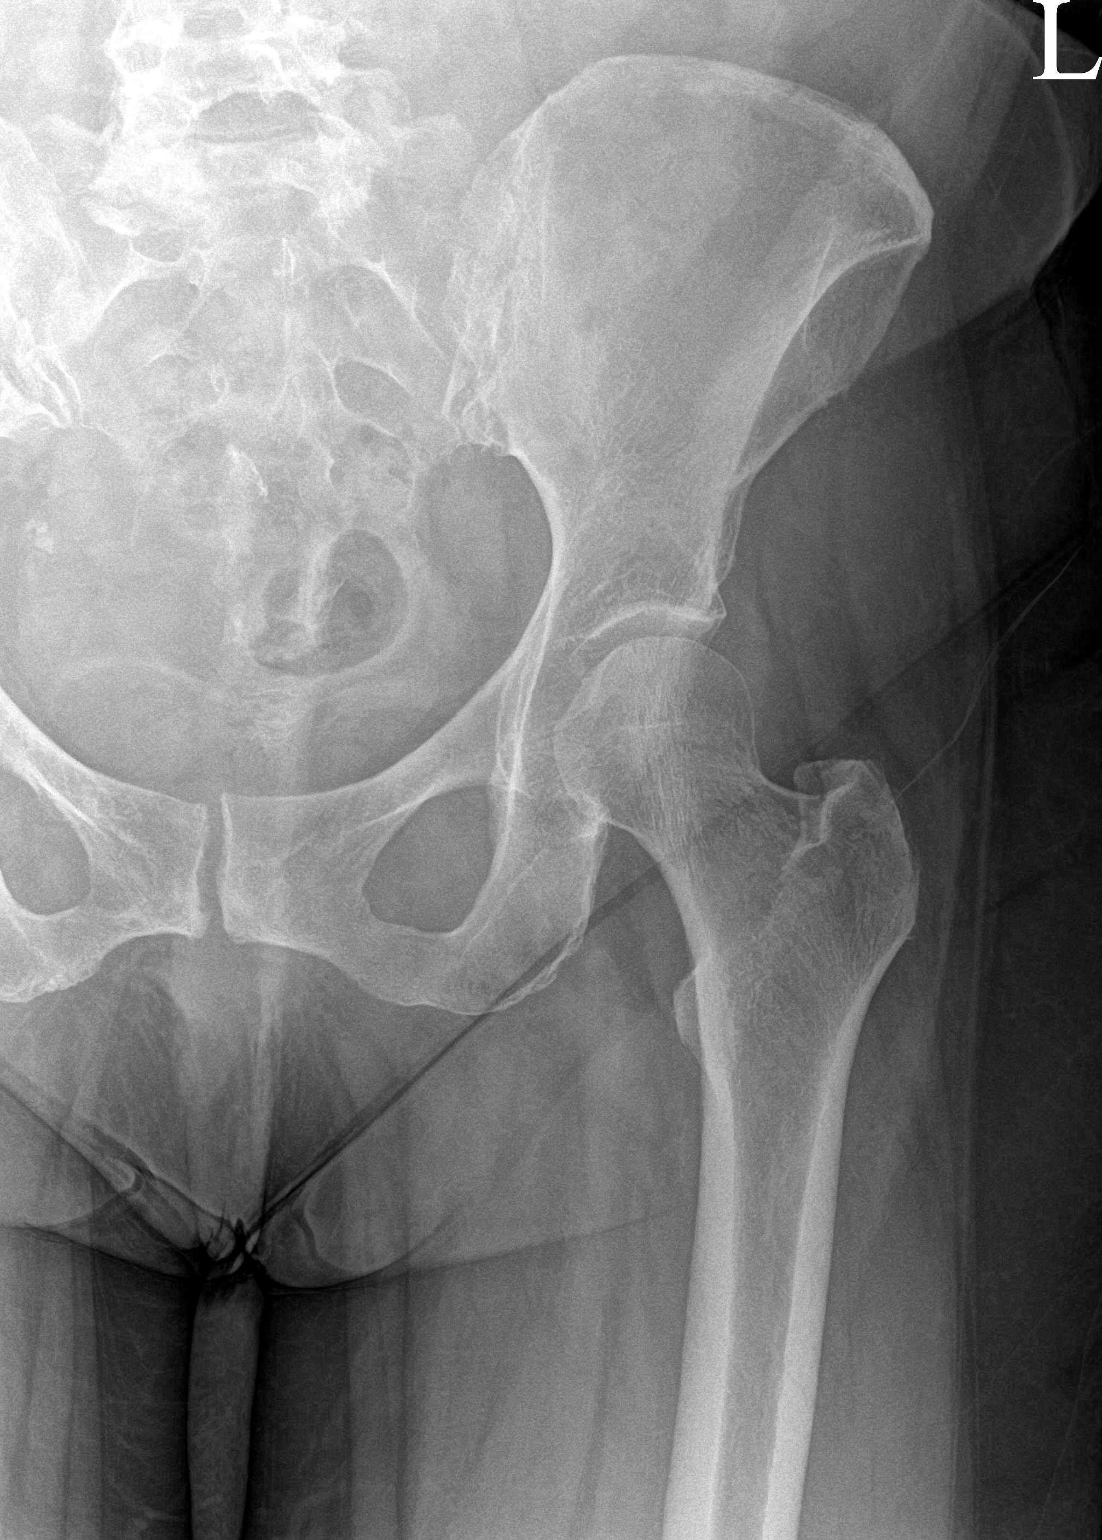

[hip frog leg]
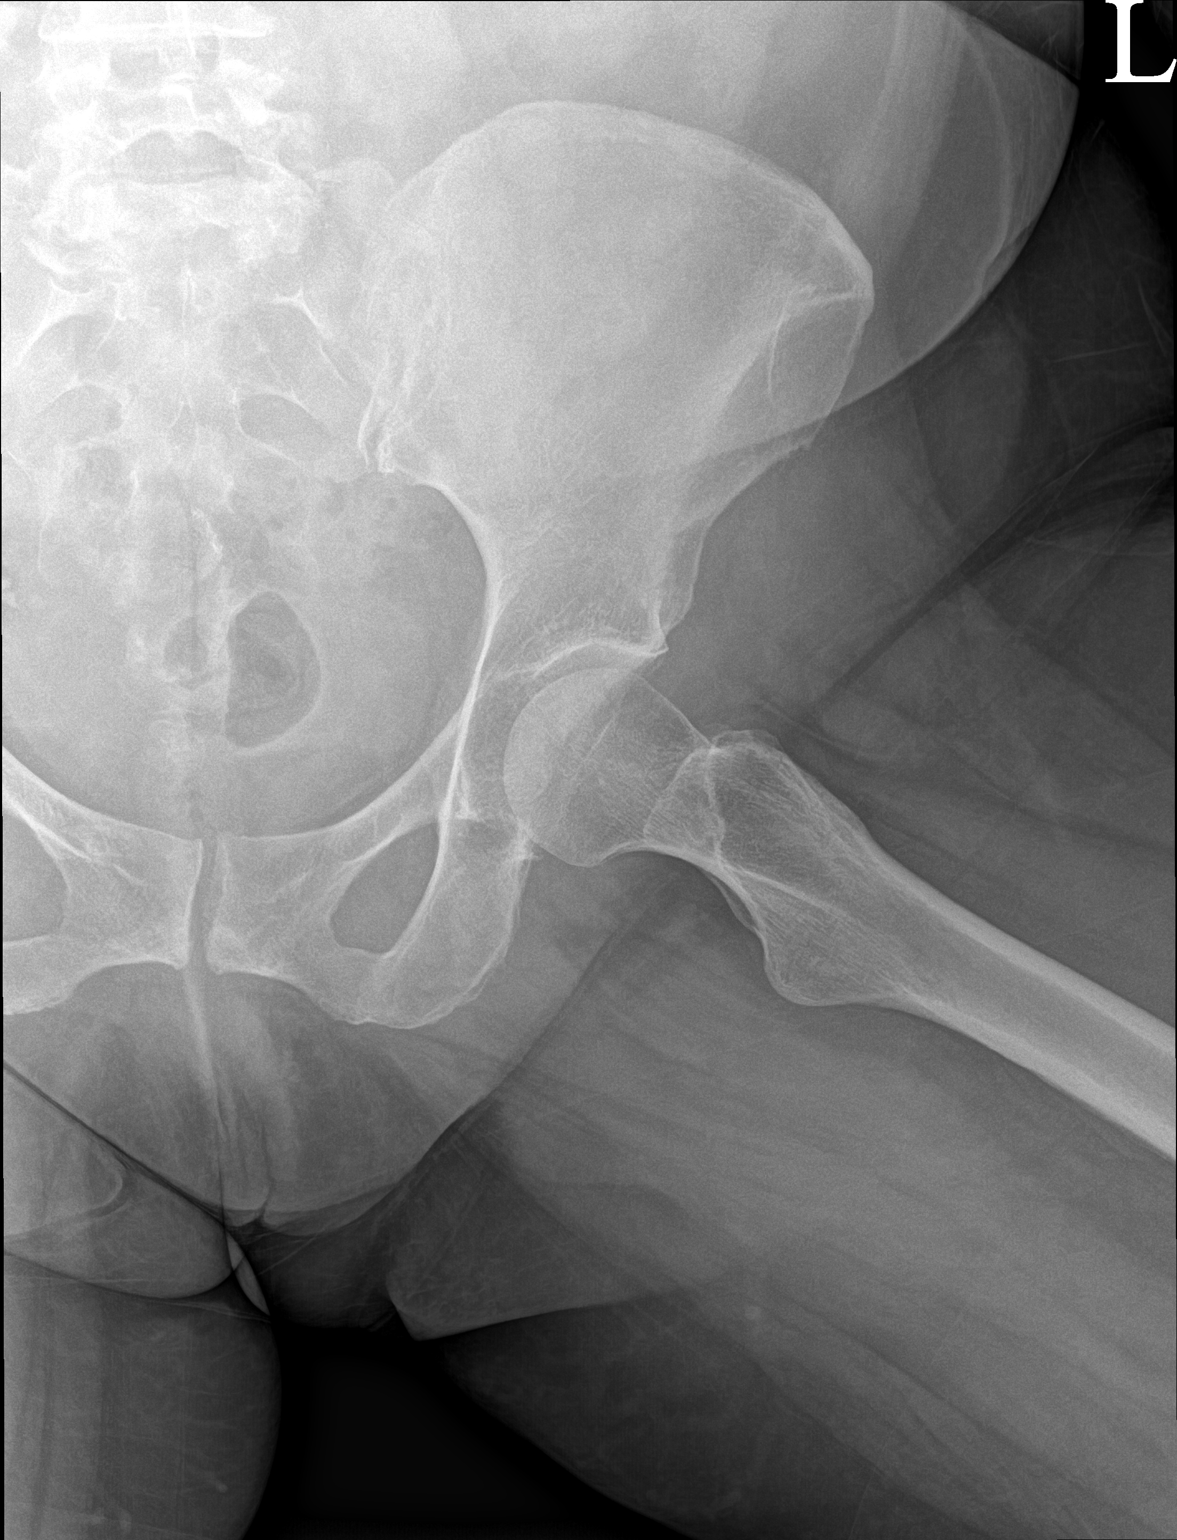

[3 of 3 positions shown; findings below may reference images not displayed]

FINDINGS: There is no acute fracture or dislocation. Femoroacetabular
alignment is normal. The joint spaces are preserved. There is no
significant degenerative change of either hip. The SI joints and
symphysis pubis are intact. The soft tissues are unremarkable.
IMPRESSION: Unremarkable hip radiographs. No acute finding or significant
degenerative change.

## 2023-05-02 ENCOUNTER — Other Ambulatory Visit: Payer: Self-pay | Admitting: Thoracic Surgery (Cardiothoracic Vascular Surgery)

## 2023-05-02 DIAGNOSIS — I712 Thoracic aortic aneurysm, without rupture, unspecified: Secondary | ICD-10-CM

## 2023-05-29 ENCOUNTER — Other Ambulatory Visit: Payer: Self-pay

## 2023-05-29 ENCOUNTER — Ambulatory Visit
Admission: RE | Admit: 2023-05-29 | Discharge: 2023-05-29 | Disposition: A | Discharge status: Home / Self Care | Payer: BC Managed Care – PPO | Source: Ambulatory Visit | Attending: Thoracic Surgery (Cardiothoracic Vascular Surgery) | Admitting: Thoracic Surgery (Cardiothoracic Vascular Surgery)

## 2023-05-29 ENCOUNTER — Telehealth: Payer: Self-pay

## 2023-05-29 DIAGNOSIS — I2699 Other pulmonary embolism without acute cor pulmonale: Secondary | ICD-10-CM

## 2023-05-29 DIAGNOSIS — I712 Thoracic aortic aneurysm, without rupture, unspecified: Secondary | ICD-10-CM

## 2023-05-29 MED ORDER — APIXABAN 5 MG PO TABS
5.0000 mg | ORAL_TABLET | Freq: Two times a day (BID) | ORAL | 2 refills | Status: DC
Start: 2023-05-29 — End: 2023-09-01

## 2023-05-29 MED ORDER — IOPAMIDOL (ISOVUE-370) INJECTION 76%
75.0000 mL | Freq: Once | INTRAVENOUS | Status: AC | PRN
  Administered 2023-05-29: 75 mL via INTRAVENOUS

## 2023-05-29 NOTE — Telephone Encounter (Signed)
RX for Eliquis 5 mg BID sent to [facility].

## 2023-05-30 ENCOUNTER — Ambulatory Visit: Payer: BC Managed Care – PPO

## 2023-05-30 ENCOUNTER — Ambulatory Visit (HOSPITAL_COMMUNITY)
Admission: RE | Admit: 2023-05-30 | Discharge: 2023-05-30 | Disposition: A | Discharge status: Home / Self Care | Payer: BC Managed Care – PPO | Source: Ambulatory Visit | Attending: Thoracic Surgery (Cardiothoracic Vascular Surgery) | Admitting: Thoracic Surgery (Cardiothoracic Vascular Surgery)

## 2023-05-30 ENCOUNTER — Ambulatory Visit (HOSPITAL_BASED_OUTPATIENT_CLINIC_OR_DEPARTMENT_OTHER)
Admission: RE | Admit: 2023-05-30 | Discharge: 2023-05-30 | Disposition: A | Discharge status: Home / Self Care | Payer: BC Managed Care – PPO | Source: Ambulatory Visit | Attending: Thoracic Surgery (Cardiothoracic Vascular Surgery) | Admitting: Thoracic Surgery (Cardiothoracic Vascular Surgery)

## 2023-05-30 VITALS — BP 150/89 | HR 95 | Resp 20 | Ht 64.0 in | Wt 172.6 lb

## 2023-05-30 DIAGNOSIS — I712 Thoracic aortic aneurysm, without rupture, unspecified: Secondary | ICD-10-CM | POA: Diagnosis not present

## 2023-05-30 DIAGNOSIS — I2699 Other pulmonary embolism without acute cor pulmonale: Secondary | ICD-10-CM | POA: Insufficient documentation

## 2023-05-30 NOTE — Patient Instructions (Signed)
Discussed minor activity restrictions

## 2023-05-30 NOTE — Progress Notes (Signed)
Bilateral lower extremity venous and bilateral upper extremity venous studies completed.   Preliminary results relayed to Morrie Sheldon, RN at Dr. Sunday Corn office. Patient is currently being treated for PE with Eliquis. Office gave the okay to send patient home.  Please see CV Procedures for preliminary results.  Javelle Donigan, RVT  2:33 PM 05/30/23

## 2023-05-30 NOTE — Progress Notes (Signed)
Subjective:     Patient ID: Kaitlyn Garcia, female    DOB: Jan 16, 1961, 62 y.o.   MRN: [id number]  No chief complaint on file.   HPI Patient is in today for patient was referred to our office in regards to a ascending thoracic aortic aneurysm that was found on a CT calcium scan.  She subsequently obtained a CTA of the chest and the results are as described below.  Her thoracic aorta is not aneurysmal range but she was noted to have a incidental finding also described below of a small nonocclusive pulmonary artery embolus.  She does have a history of factor Leiden 5 homozygous trait discovered on genetic testing after her daughters were noted have this finding.  She denies symptoms of shortness of breath or chest pain.  She generally feels well and exercises regularly.  She has had no recent trauma or long travel.  She is followed carefully by her primary physician but has never seen hematology.    Patient Active Problem List   Diagnosis Date Noted   Bursitis of left hip 08/16/2021   Heterozygous factor V Leiden mutation ([facility]) 07/21/2019   Hypertension 08/13/2018   Ankle pain 07/17/2013    Past Medical History:  Diagnosis Date   Adenoma    COVID 11/2020   High cholesterol    Hypertension    Polyp, colonic    Seasonal affective disorder ([facility])      Past Surgical History:  Procedure Laterality Date   APPENDECTOMY  2000   CHOLECYSTECTOMY  2001   COLONOSCOPY     DILATION AND CURETTAGE OF UTERUS     INCISIONAL HERNIA REPAIR  2001   OOPHORECTOMY Left 2002   POLYPECTOMY     TONSILLECTOMY  1965       Current Outpatient Medications  Medication Instructions   albuterol (VENTOLIN HFA) 108 (90 Base) MCG/ACT inhaler 2 puffs, Inhalation, Every 6 hours PRN   amLODipine (NORVASC) 10 mg, Oral, Daily   apixaban (ELIQUIS) 5 mg, Oral, 2 times daily   Cholecalciferol (VITAMIN D-3 PO) 2,000 Units, Oral, Daily   Cyanocobalamin (B-12 PO) Oral, Daily   fluticasone (FLONASE) 50 MCG/ACT  nasal spray SPRAY 2 SPRAYS INTO EACH NOSTRIL EVERY DAY   MAGNESIUM PO Oral, Daily   Multiple Vitamin (MULTIVITAMIN) capsule 1 capsule, Oral, Daily     Social History   Occupational History   Not on file  Tobacco Use   Smoking status: Never   Smokeless tobacco: Never  Vaping Use   Vaping status: Never Used  Substance and Sexual Activity   Alcohol use: Yes    Alcohol/week: 2.0 - 3.0 standard drinks of alcohol    Types: 2 - 3 Glasses of wine per week    Comment: 2-3 glasses of wine a week   Drug use: No   Sexual activity: Yes    Partners: Male    Birth control/protection: Post-menopausal    No Known Allergies    Objective:    LMP 01/24/2008 (Approximate) Comment: spotting BP Readings from Last 3 Encounters:  05/30/23 (!) 150/89  02/10/23 134/83  10/28/22 124/80   Wt Readings from Last 3 Encounters:  05/30/23 172 lb 9.6 oz (78.3 kg)  02/10/23 167 lb 8 oz (76 kg)  10/28/22 170 lb 8 oz (77.3 kg)      Physical Exam Constitutional:      Appearance: Normal appearance. She is not ill-appearing.  HENT:     Head: Normocephalic and atraumatic.  Eyes:  General: No scleral icterus. Neck:     Vascular: No carotid bruit.  Cardiovascular:     Rate and Rhythm: Normal rate and regular rhythm.     Pulses: Normal pulses.     Heart sounds: Normal heart sounds.  Pulmonary:     Effort: Pulmonary effort is normal.     Breath sounds: Normal breath sounds.  Abdominal:     Palpations: Abdomen is soft.     Tenderness: There is no abdominal tenderness.  Skin:    General: Skin is warm and dry.     Capillary Refill: Capillary refill takes less than 2 seconds.     Coloration: Skin is not pale.     Findings: No bruising, lesion or rash.  Neurological:     General: No focal deficit present.     Mental Status: She is alert.  Psychiatric:        Mood and Affect: Mood normal.        Behavior: Behavior normal.        Thought Content: Thought content normal.        Judgment:  Judgment normal.    ADDENDUM REPORT: 05/29/2023 14:12   ADDENDUM: These results were called by telephone at the time of interpretation on 05/29/2023 at 2:11 pm to provider RN Nile Dear., who verbally acknowledged these results.     Electronically Signed   By: Tish Frederickson M.D.   On: 05/29/2023 14:12    Addended by Julieta Bellini, MD on 05/29/2023  2:14 PM    Study Result  Narrative & Impression  CLINICAL DATA:  Aortic aneurysm suspected ascending thoracic aortic dilation   EXAM: CT ANGIOGRAPHY CHEST WITH CONTRAST   TECHNIQUE: Multidetector CT imaging of the chest was performed using the standard protocol during bolus administration of intravenous contrast. Multiplanar CT image reconstructions and MIPs were obtained to evaluate the vascular anatomy.   RADIATION DOSE REDUCTION: This exam was performed according to the departmental dose-optimization program which includes automated exposure control, adjustment of the mA and/or kV according to patient size and/or use of iterative reconstruction technique.   CONTRAST:  75mL ISOVUE-370 IOPAMIDOL (ISOVUE-370) INJECTION 76%   COMPARISON:  CT abdomen pelvis 12/13/1998 report without imaging, CT cardiac 04/11/2022   FINDINGS: Cardiovascular: Preferential opacification of the thoracic aorta. No evidence of thoracic aortic aneurysm or dissection. Normal heart size. No significant pericardial effusion. No atherosclerotic plaque of the thoracic aorta. No coronary artery calcifications. The main pulmonary artery is normal in caliber. Right middle lobe nonocclusive subsegmental pulmonary embolus. The main pulmonary artery is normal in caliber.   Mediastinum/Nodes: No enlarged mediastinal, hilar, or axillary lymph nodes. Thyroid gland, trachea, and esophagus demonstrate no significant findings.   Lungs/Pleura: No focal consolidation. No pulmonary nodule. No pulmonary mass. No pleural effusion. No pneumothorax.   Upper  Abdomen: Status post cholecystectomy. Focal fatty infiltration of the left hepatic lobe along the false form ligament. Colonic diverticulosis. No acute abnormality.   Musculoskeletal:   No chest wall abnormality.   No suspicious lytic or blastic osseous lesions. No acute displaced fracture.   Review of the MIP images confirms the above findings.   IMPRESSION: 1. Incidentally noted nonocclusive right middle lobe subsegmental pulmonary embolus. No associated pulmonary infarction or right heart strain. 2. No acute thoracic aorta abnormality. No correlation with prior visualized ascending thoracic aorta aneurysm-previously possibly due to noncontrast and motion artifact. 3. No acute intrapulmonary abnormality.   Electronically Signed: By: Tish Frederickson M.D. On: 05/29/2023 14:06  Assessment & Plan:   The case was discussed yesterday with Dr. Dorris Fetch and the patient was started on oral Eliquis.  We will refer her to hematology as well as obtain lower extremity and upper extremity duplex scans.  She does not require ongoing follow-up scans of her aorta at this time.  If there are any unusual findings with her duplex scans we will contact her but does not necessarily require any ongoing follow-up at our office and should be able to be managed by primary care and hematology.     Problem List Items Addressed This Visit   None   No orders of the defined types were placed in this encounter.   No follow-ups on file.  Rowe Clack, PA-C

## 2023-06-01 ENCOUNTER — Telehealth: Payer: Self-pay

## 2023-06-01 ENCOUNTER — Other Ambulatory Visit: Payer: Self-pay | Admitting: Surgical

## 2023-06-01 NOTE — Telephone Encounter (Signed)
-----   Message from Loreli Slot sent at 05/30/2023  6:09 PM EDT ----- Regarding: RE: Results Nothing else to do if she is already on Eliquis  [facility] ----- Message ----- From: Steve Rattler, RN Sent: 05/30/2023   2:34 PM EDT To: Loreli Slot, MD Subject: Results                                        Hey,  Results from the venous doppler came back, will be uploaded in Epic soon. She was positive for DVT in her right leg, posterior tibial vein. I know she was started on Eliquis. Please advise.  Thanks, Morrie Sheldon

## 2023-06-05 ENCOUNTER — Other Ambulatory Visit: Payer: Self-pay | Admitting: Surgical

## 2023-06-13 DIAGNOSIS — N390 Urinary tract infection, site not specified: Secondary | ICD-10-CM | POA: Diagnosis not present

## 2023-06-13 DIAGNOSIS — R399 Unspecified symptoms and signs involving the genitourinary system: Secondary | ICD-10-CM | POA: Diagnosis not present

## 2023-06-23 NOTE — Progress Notes (Unsigned)
[facility]   [phone] Fax:([phone]   Clinic New Consult Note   Patient Care Team: Karie Georges, MD as PCP - General (Family Medicine) Patton Salles, MD as Consulting Physician (Obstetrics and Gynecology) Antony Contras, MD as Consulting Physician (Ophthalmology) 06/24/2023  CHIEF COMPLAINTS/PURPOSE OF CONSULTATION:  Heterozygous factor V Leiden mutation and history of thrombosis  Referring physician: Loreli Slot, MD   HISTORY OF PRESENTING ILLNESS:  Kaitlyn Garcia 62 y.o. female is here because of recent episode of PE and a DVT, in the setting of no factor V Leiden mutation.  She was referred by thoracic surgeon Dr. Dorris Fetch.  Patient's daughter was found to have positive factor V Leiden mutation, patient's mother had history of thrombosis.  So she underwent genetic testing for factor V Leiden mutation in 2020 and was found to be a heterozygous few mutation.  She had a screening she had CT cardiac scoring test in June 2023, which showed aorta aneurysm, so she was referred to thoracic surgeon Dr. Dorris Fetch.  A follow-up CT scan was performed on May 29, 2023, which was negative for thoracic aorta abnormality, but incidentally a nonocclusive right middle lobe subsegmental pulmonary embolism was noticed.  She subsequently underwent bilateral upper and lower extremity Doppler, which showed acute DVT involving 1 right posterior tibial vein.  She denies acute injury, virus infection, or long distance travel before she was found to have PE and a DVT.  She has been sedentary during this summer.  She denies any dyspnea, leg edema or pain.  No other new symptoms.  She was started on Eliquis, she has been tolerating well without any bleeding.  She did not notice an episode of UTI after she started Eliquis and finished her course of antibiotics.  She is up-to-date for her cancer screening, including mammogram and colonoscopy.  She overall  feels well, no recent weight loss, or fatigue.   MEDICAL HISTORY:  Past Medical History:  Diagnosis Date   Adenoma    COVID 11/2020   High cholesterol    Hypertension    Polyp, colonic    Seasonal affective disorder (HCC)     SURGICAL HISTORY: Past Surgical History:  Procedure Laterality Date   APPENDECTOMY  2000   CHOLECYSTECTOMY  2001   COLONOSCOPY     DILATION AND CURETTAGE OF UTERUS     INCISIONAL HERNIA REPAIR  2001   OOPHORECTOMY Left 2002   POLYPECTOMY     TONSILLECTOMY  1965    SOCIAL HISTORY: Social History   Socioeconomic History   Marital status: Married    Spouse name: Not on file   Number of children: 2   Years of education: Not on file   Highest education level: Bachelor's degree (e.g., BA, AB, BS)  Occupational History   Not on file  Tobacco Use   Smoking status: Never   Smokeless tobacco: Never  Vaping Use   Vaping status: Never Used  Substance and Sexual Activity   Alcohol use: Yes    Alcohol/week: 2.0 - 3.0 standard drinks of alcohol    Types: 2 - 3 Glasses of wine per week    Comment: 2-3 glasses of wine a week, STOPPED in 05/2023   Drug use: No   Sexual activity: Yes    Partners: Male    Birth control/protection: Post-menopausal  Other Topics Concern   Not on file  Social History Narrative   Not on file   Social Determinants of Health  Financial Resource Strain: Low Risk  (10/03/2022)   Overall Financial Resource Strain (CARDIA)    Difficulty of Paying Living Expenses: Not hard at all  Food Insecurity: No Food Insecurity (10/03/2022)   Hunger Vital Sign    Worried About Running Out of Food in the Last Year: Never true    Ran Out of Food in the Last Year: Never true  Transportation Needs: No Transportation Needs (10/03/2022)   PRAPARE - Administrator, Civil Service (Medical): No    Lack of Transportation (Non-Medical): No  Physical Activity: Insufficiently Active (10/03/2022)   Exercise Vital Sign    Days of  Exercise per Week: 4 days    Minutes of Exercise per Session: 30 min  Stress: No Stress Concern Present (10/03/2022)   [employer] of Occupational Health - Occupational Stress Questionnaire    Feeling of Stress : Only a little  Social Connections: Socially Integrated (10/03/2022)   Social Connection and Isolation Panel [NHANES]    Frequency of Communication with Friends and Family: More than three times a week    Frequency of Social Gatherings with Friends and Family: More than three times a week    Attends Religious Services: More than 4 times per year    Active Member of [employer] or Organizations: Yes    Attends Engineer, structural: More than 4 times per year    Marital Status: Married  Catering manager Violence: Not on file    FAMILY HISTORY: Family History  Problem Relation Age of Onset   Clotting disorder Mother    Hypertension Mother    Osteoarthritis Mother    Depression Mother    High Cholesterol Mother    Diabetes Father    Hypertension Father    Heart attack Father 60   Heart disease Father    Breast cancer Sister 48   High blood pressure Brother    Arthritis Maternal Grandmother    Stroke Maternal Grandfather    Clotting disorder Paternal Grandmother    High blood pressure Paternal Grandmother    Hearing loss Paternal Grandmother    Hearing loss Paternal Grandfather    Heart attack Paternal Grandfather    Heart disease Paternal Grandfather    Esophageal cancer Neg Hx    Stomach cancer Neg Hx    Rectal cancer Neg Hx    Colon cancer Neg Hx     ALLERGIES:  has No Known Allergies.  MEDICATIONS:  Current Outpatient Medications  Medication Sig Dispense Refill   amLODipine (NORVASC) 10 MG tablet TAKE 1 TABLET BY MOUTH EVERY DAY 90 tablet 3   apixaban (ELIQUIS) 5 MG TABS tablet Take 1 tablet (5 mg total) by mouth 2 (two) times daily. 60 tablet 2   Cholecalciferol (VITAMIN D-3 PO) Take 2,000 Units by mouth daily.     Cyanocobalamin (B-12 PO) Take by  mouth daily.     MAGNESIUM PO Take by mouth daily.     Multiple Vitamin (MULTIVITAMIN) capsule Take 1 capsule by mouth daily.     Current Facility-Administered Medications  Medication Dose Route Frequency Provider Last Rate Last Admin   0.9 %  sodium chloride infusion  500 mL Intravenous Once Pyrtle, Carie Caddy, MD        REVIEW OF SYSTEMS:   Constitutional: Denies fevers, chills or abnormal night sweats Eyes: Denies blurriness of vision, double vision or watery eyes Ears, nose, mouth, throat, and face: Denies mucositis or sore throat Respiratory: Denies cough, dyspnea or wheezes Cardiovascular: Denies palpitation,  chest discomfort or lower extremity swelling Gastrointestinal:  Denies nausea, heartburn or change in bowel habits Skin: Denies abnormal skin rashes Lymphatics: Denies new lymphadenopathy or easy bruising Neurological:Denies numbness, tingling or new weaknesses Behavioral/Psych: Mood is stable, no new changes  All other systems were reviewed with the patient and are negative.  PHYSICAL EXAMINATION: ECOG PERFORMANCE STATUS: 0 - Asymptomatic  Vitals:   06/24/23 0904  BP: (!) 145/88  Pulse: 91  Resp: 16  Temp: 97.9 F (36.6 C)  SpO2: 98%   Filed Weights   06/24/23 0904  Weight: 173 lb 4.8 oz (78.6 kg)    GENERAL:alert, no distress and comfortable SKIN: skin color, texture, turgor are normal, no rashes or significant lesions EYES: normal, conjunctiva are pink and non-injected, sclera clear OROPHARYNX:no exudate, no erythema and lips, buccal mucosa, and tongue normal  NECK: supple, thyroid normal size, non-tender, without nodularity LYMPH:  no palpable lymphadenopathy in the cervical, axillary or inguinal LUNGS: clear to auscultation and percussion with normal breathing effort HEART: regular rate & rhythm and no murmurs and no lower extremity edema ABDOMEN:abdomen soft, non-tender and normal bowel sounds Musculoskeletal:no cyanosis of digits and no clubbing  PSYCH:  alert & oriented x 3 with fluent speech NEURO: no focal motor/sensory deficits  LABORATORY DATA:  I have reviewed the data as listed No results found for this or any previous visit (from the past 2160 hour(s)).  RADIOGRAPHIC STUDIES: I have personally reviewed the radiological images as listed and agreed with the findings in the report. VAS [place] UPPER EXTREMITY VENOUS DUPLEX  Result Date: 05/30/2023 UPPER VENOUS STUDY  Patient Name:  Arelis ANN Tufo  Date of Exam:   05/30/2023 Medical Rec #: [id number]        Accession #:    [id number] Date of Birth: 1961/10/14        Patient Gender: F Patient Age:   77 years Exam Location:  [facility] Procedure:      VAS [place] UPPER EXTREMITY VENOUS DUPLEX Referring Phys: Viviann Spare HENDRICKSON --------------------------------------------------------------------------------  Indications: pulmonary embolism Risk Factors: Factor V Leiden. Anticoagulation: Eliquis. Comparison Study: No previous study. Performing Technologist: McKayla Maag RVT, [place]  Examination Guidelines: A complete evaluation includes B-mode imaging, spectral Doppler, color Doppler, and power Doppler as needed of all accessible portions of each vessel. Bilateral testing is considered an integral part of a complete examination. Limited examinations for reoccurring indications may be performed as noted.  Right Findings: +----------+------------+---------+-----------+----------+-------+ RIGHT     CompressiblePhasicitySpontaneousPropertiesSummary +----------+------------+---------+-----------+----------+-------+ IJV           Full       Yes       Yes                      +----------+------------+---------+-----------+----------+-------+ Subclavian    Full       Yes       Yes                      +----------+------------+---------+-----------+----------+-------+ Axillary      Full       Yes       Yes                      +----------+------------+---------+-----------+----------+-------+  Brachial      Full       Yes       Yes                      +----------+------------+---------+-----------+----------+-------+  Radial        Full                                          +----------+------------+---------+-----------+----------+-------+ Ulnar         Full                                          +----------+------------+---------+-----------+----------+-------+ Cephalic      Full                                          +----------+------------+---------+-----------+----------+-------+ Basilic       Full                                          +----------+------------+---------+-----------+----------+-------+  Left Findings: +----------+------------+---------+-----------+----------+-------+ LEFT      CompressiblePhasicitySpontaneousPropertiesSummary +----------+------------+---------+-----------+----------+-------+ IJV           Full       Yes       Yes                      +----------+------------+---------+-----------+----------+-------+ Subclavian    Full       Yes       Yes                      +----------+------------+---------+-----------+----------+-------+ Axillary      Full       Yes       Yes                      +----------+------------+---------+-----------+----------+-------+ Brachial      Full       Yes       Yes                      +----------+------------+---------+-----------+----------+-------+ Radial        Full                                          +----------+------------+---------+-----------+----------+-------+ Ulnar         Full                                          +----------+------------+---------+-----------+----------+-------+ Cephalic      Full                                          +----------+------------+---------+-----------+----------+-------+ Basilic       Full                                           +----------+------------+---------+-----------+----------+-------+  Summary: No evidence of deep vein or  superficial vein thrombosis involving the right and left upper extremities.  *See table(s) above for measurements and observations.  Diagnosing physician: Waverly Ferrari MD Electronically signed by Waverly Ferrari MD on 05/30/2023 at 5:45:09 PM.    Final    VAS [place] LOWER EXTREMITY VENOUS (DVT)  Result Date: 05/30/2023  Lower Venous DVT Study Patient Name:  [facility]  Date of Exam:   05/30/2023 Medical Rec #: [id number]        Accession #:    [id number] Date of Birth: Nov 17, 1960        Patient Gender: F Patient Age:   58 years Exam Location:  [facility] Procedure:      VAS [place] LOWER EXTREMITY VENOUS (DVT) Referring Phys: Viviann Spare HENDRICKSON --------------------------------------------------------------------------------  Indications: Pulmonary embolism.  Risk Factors: Factor V Leiden. Anticoagulation: Eliquis. Comparison Study: No previous study. Performing Technologist: McKayla Maag RVT, [place]  Examination Guidelines: A complete evaluation includes B-mode imaging, spectral Doppler, color Doppler, and power Doppler as needed of all accessible portions of each vessel. Bilateral testing is considered an integral part of a complete examination. Limited examinations for reoccurring indications may be performed as noted. The reflux portion of the exam is performed with the patient in reverse Trendelenburg.  +---------+---------------+---------+-----------+----------+--------------+ RIGHT    CompressibilityPhasicitySpontaneityPropertiesThrombus Aging +---------+---------------+---------+-----------+----------+--------------+ CFV      Full           Yes      Yes                                 +---------+---------------+---------+-----------+----------+--------------+ SFJ      Full                                                         +---------+---------------+---------+-----------+----------+--------------+ FV Prox  Full                                                        +---------+---------------+---------+-----------+----------+--------------+ FV Mid   Full                                                        +---------+---------------+---------+-----------+----------+--------------+ FV DistalFull                                                        +---------+---------------+---------+-----------+----------+--------------+ PFV      Full                                                        +---------+---------------+---------+-----------+----------+--------------+ POP      Full  Yes      Yes                                 +---------+---------------+---------+-----------+----------+--------------+ PTV      Partial        No       No                   Acute          +---------+---------------+---------+-----------+----------+--------------+ PERO     Full                                                        +---------+---------------+---------+-----------+----------+--------------+   +---------+---------------+---------+-----------+----------+--------------+ LEFT     CompressibilityPhasicitySpontaneityPropertiesThrombus Aging +---------+---------------+---------+-----------+----------+--------------+ CFV      Full           Yes      Yes                                 +---------+---------------+---------+-----------+----------+--------------+ SFJ      Full                                                        +---------+---------------+---------+-----------+----------+--------------+ FV Prox  Full                                                        +---------+---------------+---------+-----------+----------+--------------+ FV Mid   Full                                                         +---------+---------------+---------+-----------+----------+--------------+ FV DistalFull                                                        +---------+---------------+---------+-----------+----------+--------------+ PFV      Full                                                        +---------+---------------+---------+-----------+----------+--------------+ POP      Full           Yes      Yes                                 +---------+---------------+---------+-----------+----------+--------------+ PTV      Full                                                        +---------+---------------+---------+-----------+----------+--------------+  PERO     Full                                                        +---------+---------------+---------+-----------+----------+--------------+     Summary: RIGHT: - Findings consistent with acute deep vein thrombosis involving one right posterior tibial vein. - No cystic structure found in the popliteal fossa.  LEFT: - There is no evidence of deep vein thrombosis in the lower extremity.  - No cystic structure found in the popliteal fossa.  *See table(s) above for measurements and observations. Electronically signed by Waverly Ferrari MD on 05/30/2023 at 5:43:58 PM.    Final    CT ANGIO CHEST AORTA W/CM & OR WO/CM  Addendum Date: 05/29/2023   ADDENDUM REPORT: 05/29/2023 14:12 ADDENDUM: These results were called by telephone at the time of interpretation on 05/29/2023 at 2:11 pm to provider RN Nile Dear., who verbally acknowledged these results. Electronically Signed   By: Tish Frederickson M.D.   On: 05/29/2023 14:12   Result Date: 05/29/2023 CLINICAL DATA:  Aortic aneurysm suspected ascending thoracic aortic dilation EXAM: CT ANGIOGRAPHY CHEST WITH CONTRAST TECHNIQUE: Multidetector CT imaging of the chest was performed using the standard protocol during bolus administration of intravenous contrast. Multiplanar CT image reconstructions  and MIPs were obtained to evaluate the vascular anatomy. RADIATION DOSE REDUCTION: This exam was performed according to the departmental dose-optimization program which includes automated exposure control, adjustment of the mA and/or kV according to patient size and/or use of iterative reconstruction technique. CONTRAST:  75mL ISOVUE-370 IOPAMIDOL (ISOVUE-370) INJECTION 76% COMPARISON:  CT abdomen pelvis 12/13/1998 report without imaging, CT cardiac 04/11/2022 FINDINGS: Cardiovascular: Preferential opacification of the thoracic aorta. No evidence of thoracic aortic aneurysm or dissection. Normal heart size. No significant pericardial effusion. No atherosclerotic plaque of the thoracic aorta. No coronary artery calcifications. The main pulmonary artery is normal in caliber. Right middle lobe nonocclusive subsegmental pulmonary embolus. The main pulmonary artery is normal in caliber. Mediastinum/Nodes: No enlarged mediastinal, hilar, or axillary lymph nodes. Thyroid gland, trachea, and esophagus demonstrate no significant findings. Lungs/Pleura: No focal consolidation. No pulmonary nodule. No pulmonary mass. No pleural effusion. No pneumothorax. Upper Abdomen: Status post cholecystectomy. Focal fatty infiltration of the left hepatic lobe along the false form ligament. Colonic diverticulosis. No acute abnormality. Musculoskeletal: No chest wall abnormality. No suspicious lytic or blastic osseous lesions. No acute displaced fracture. Review of the MIP images confirms the above findings. IMPRESSION: 1. Incidentally noted nonocclusive right middle lobe subsegmental pulmonary embolus. No associated pulmonary infarction or right heart strain. 2. No acute thoracic aorta abnormality. No correlation with prior visualized ascending thoracic aorta aneurysm-previously possibly due to noncontrast and motion artifact. 3. No acute intrapulmonary abnormality. Electronically Signed: By: Tish Frederickson M.D. On: 05/29/2023 14:06     ASSESSMENT:  Unprovoked  DVT/PE in the setting of factor V Leiden heterozygous mutation (+)  PLAN:  I reviewed with the patient about the plan for care for DVT/PE.  Her episode of blood clot appeared to be unprovoked.  She has no factor V Leiden mutation, which is likely the cause of her thrombosis.  I recommend lifelong anticoagulation, if no high risk of bleeding.  We discussed about various options of anticoagulation therapies including warfarin, low molecular weight heparin such as Lovenox or newer agents such as Rivaroxaban or Eliquis.  Some of the risks and benefits discussed including costs involved, the need for monitoring, risks of life-threatening bleeding/hospitalization, reversibility of each agent in the event of bleeding or overdose, safety profile of each drug and taking into account other social issues such as ease of administration of medications, etc. Ultimately.  She has been tolerating Eliquis well, her only concern is the cost.  We discussed the option of switching to Coumadin if needed.  We discussed perioperative anticoagulation management, okay to hold Eliquis 2 to 3 days before surgery or invasive procedure.  I recommend the patient to use elastic compression stockings at 20-30 mmHg to reduce risks of chronic thrombophlebitis.  Another main issue we discussed today included the role of screening other family members for thrombophilia disorder.  Her daughter Misty Stanley has been screened, 1 is positive.  I encouraged her to let her maternal side of family members know also.  Finally, at the end of our consultation today, I reinforced the importance of preventive strategies such as avoiding hormonal supplement, avoiding cigarette smoking, keeping up-to-date with screening programs for early cancer detection, frequent ambulation for long distance travel and aggressive DVT prophylaxis in all surgical settings.  I have not made a return appointment for the patient to come back. I  would be happy to assist in perioperative DVT management in the future should she need any interruption of her anticoagulation therapy for elective procedures.  Plan -I recommend lifelong anticoagulation, she will continue Eliquis, okay to switch to Coumadin if she has high co-pay. -I will see her as needed.   All questions were answered. The patient knows to call the clinic with any problems, questions or concerns. I spent 25 minutes counseling the patient face to face. The total time spent in the appointment was 30 minutes and more than 50% was on counseling.     Malachy Mood, MD 06/24/2023 9:30 AM

## 2023-06-24 ENCOUNTER — Other Ambulatory Visit: Payer: Self-pay

## 2023-06-24 ENCOUNTER — Encounter: Payer: Self-pay | Admitting: Hematology

## 2023-06-24 ENCOUNTER — Inpatient Hospital Stay: Payer: BC Managed Care – PPO

## 2023-06-24 ENCOUNTER — Inpatient Hospital Stay: Payer: BC Managed Care – PPO | Attending: Hematology | Admitting: Hematology

## 2023-06-24 VITALS — BP 145/88 | HR 91 | Temp 97.9°F | Resp 16 | Wt 173.3 lb

## 2023-06-24 DIAGNOSIS — I2693 Single subsegmental pulmonary embolism without acute cor pulmonale: Secondary | ICD-10-CM | POA: Diagnosis not present

## 2023-06-24 DIAGNOSIS — Z7901 Long term (current) use of anticoagulants: Secondary | ICD-10-CM | POA: Diagnosis not present

## 2023-06-24 DIAGNOSIS — Z79899 Other long term (current) drug therapy: Secondary | ICD-10-CM | POA: Diagnosis not present

## 2023-06-24 DIAGNOSIS — Z86718 Personal history of other venous thrombosis and embolism: Secondary | ICD-10-CM | POA: Insufficient documentation

## 2023-06-24 DIAGNOSIS — Z803 Family history of malignant neoplasm of breast: Secondary | ICD-10-CM | POA: Insufficient documentation

## 2023-06-24 DIAGNOSIS — I82441 Acute embolism and thrombosis of right tibial vein: Secondary | ICD-10-CM | POA: Diagnosis not present

## 2023-06-24 DIAGNOSIS — D6851 Activated protein C resistance: Secondary | ICD-10-CM | POA: Insufficient documentation

## 2023-08-15 ENCOUNTER — Encounter: Payer: Self-pay | Admitting: Family Medicine

## 2023-08-15 ENCOUNTER — Ambulatory Visit: Payer: BC Managed Care – PPO | Admitting: Family Medicine

## 2023-08-15 VITALS — BP 120/88 | HR 88 | Temp 98.3°F | Ht 64.0 in | Wt 170.8 lb

## 2023-08-15 DIAGNOSIS — I10 Essential (primary) hypertension: Secondary | ICD-10-CM | POA: Diagnosis not present

## 2023-08-15 DIAGNOSIS — Z23 Encounter for immunization: Secondary | ICD-10-CM

## 2023-08-15 DIAGNOSIS — I82431 Acute embolism and thrombosis of right popliteal vein: Secondary | ICD-10-CM | POA: Diagnosis not present

## 2023-08-15 LAB — CBC
HCT: 42.5 % (ref 36.0–46.0)
Hemoglobin: 13.9 g/dL (ref 12.0–15.0)
MCHC: 32.7 g/dL (ref 30.0–36.0)
MCV: 84.9 fL (ref 78.0–100.0)
Platelets: 304 10*3/uL (ref 150.0–400.0)
RBC: 5 Mil/uL (ref 3.87–5.11)
RDW: 14.7 % (ref 11.5–15.5)
WBC: 6.3 10*3/uL (ref 4.0–10.5)

## 2023-08-15 NOTE — Patient Instructions (Signed)
If you go for colonoscopy-- HOLD your eliquis for 48 hours before colonoscopy-- then immediately resume eliquis after the procedure.

## 2023-08-15 NOTE — Assessment & Plan Note (Signed)
BP was high in previous visits however today she is well controlled. Continue medication listed below Current hypertension medications:       Sig   amLODipine (NORVASC) 10 MG tablet (Taking) TAKE 1 TABLET BY MOUTH EVERY DAY

## 2023-08-15 NOTE — Progress Notes (Signed)
Established Patient Office Visit  Subjective   Patient ID: Kaitlyn Garcia, female    DOB: Jan 30, 1961  Age: 62 y.o. MRN: [id number]  Chief Complaint  Patient presents with   Medical Management of Chronic Issues    Pt is here for follow up today.   Patient has a history of ascending aortic aneurysm, states that when she went back to CT she had blood clots found in her lungs and her right calf. Work up revealed heterozygous Leiden V in the past, but now that she had blood clots. She will be on lifelong AC, is doing well currently with the Eliquis, no signs of obvious bleeding.   HTN -- BP in office performed and is well controlled. She  reports no side effects to the medications, no chest pain, SOB, dizziness or headaches. She has a BP cuff at home and is checking BP regularly, reports they are in the normal range.        Current Outpatient Medications  Medication Instructions   amLODipine (NORVASC) 10 mg, Oral, Daily   apixaban (ELIQUIS) 5 mg, Oral, 2 times daily   Ascorbic Acid (VITAMIN C PO) 1,000 Units, Oral, Daily   Cholecalciferol (VITAMIN D-3 PO) 2,000 Units, Oral, Daily   Cyanocobalamin (B-12 PO) Oral, Daily   MAGNESIUM PO Oral, Daily    Patient Active Problem List   Diagnosis Date Noted   Personal history of deep vein thrombosis 06/24/2023   Bursitis of left hip 08/16/2021   Heterozygous factor V Leiden mutation (HCC) 07/21/2019   Hypertension 08/13/2018   Ankle pain 07/17/2013      Review of Systems  All other systems reviewed and are negative.     Objective:     BP 120/88 (BP Location: Left Arm, Patient Position: Sitting, Cuff Size: Large)   Pulse 88   Temp 98.3 F (36.8 C) (Oral)   Ht 5\' 4"  (1.626 m)   Wt 170 lb 12.8 oz (77.5 kg)   LMP 01/24/2008 (Approximate) Comment: spotting  SpO2 99%   BMI 29.32 kg/m    Physical Exam Vitals reviewed.  Constitutional:      Appearance: Normal appearance. She is well-groomed and normal weight.   Cardiovascular:     Rate and Rhythm: Normal rate and regular rhythm.     Pulses: Normal pulses.     Heart sounds: S1 normal and S2 normal.  Pulmonary:     Effort: Pulmonary effort is normal.     Breath sounds: Normal breath sounds and air entry.  Musculoskeletal:     Right lower leg: No edema.     Left lower leg: No edema.  Neurological:     Mental Status: She is alert and oriented to person, place, and time. Mental status is at baseline.     Gait: Gait is intact.  Psychiatric:        Mood and Affect: Mood and affect normal.        Speech: Speech normal.        Behavior: Behavior normal.        Judgment: Judgment normal.       The 10-year ASCVD risk score (Arnett DK, et al., 2019) is: 6.1%    Assessment & Plan:  Acute deep vein thrombosis (DVT) of popliteal vein of right lower extremity (HCC) Now on life long Eliquis therapy due to heterozygous Leiden factor V status, she had no symptoms of PE or DVT, no swelling on today's exam, will check CBC for surveillance.   -  CBC; Future  Immunization due -     Varicella-zoster vaccine IM  Primary hypertension Assessment & Plan: BP was high in previous visits however today she is well controlled. Continue medication listed below Current hypertension medications:       Sig   amLODipine (NORVASC) 10 MG tablet (Taking) TAKE 1 TABLET BY MOUTH EVERY DAY      I spent 30 minutes today with the patient reviewing her new DVT/ PE diagnosis, consult notes, etc.       Return in about 6 months (around 02/13/2024) for annual physical exam.    Karie Georges, MD

## 2023-08-31 ENCOUNTER — Other Ambulatory Visit: Payer: Self-pay | Admitting: Thoracic Surgery (Cardiothoracic Vascular Surgery)

## 2023-08-31 DIAGNOSIS — I2699 Other pulmonary embolism without acute cor pulmonale: Secondary | ICD-10-CM

## 2023-09-01 ENCOUNTER — Telehealth: Payer: Self-pay

## 2023-09-01 DIAGNOSIS — I2699 Other pulmonary embolism without acute cor pulmonale: Secondary | ICD-10-CM

## 2023-09-01 MED ORDER — APIXABAN 5 MG PO TABS
5.0000 mg | ORAL_TABLET | Freq: Two times a day (BID) | ORAL | 2 refills | Status: DC
Start: 2023-09-01 — End: 2024-01-02

## 2023-09-01 NOTE — Telephone Encounter (Signed)
-----   Message from Loreli Slot sent at 09/01/2023  1:43 PM EST ----- Regarding: RE: Eliquis refill Refill it now, future should go to Hematology or primary care  [facility] ----- Message ----- From: Steve Rattler, RN Sent: 09/01/2023   9:58 AM EST To: Loreli Slot, MD Subject: Eliquis refill                                 Hey,  Are we continuing to refill her Eliquis? Or refer to her PCP?  Please advise, Morrie Sheldon

## 2023-09-01 NOTE — Telephone Encounter (Signed)
Will refill and make patient aware future refills need to go through her PCP or Hematologist.

## 2023-09-18 ENCOUNTER — Encounter: Payer: Self-pay | Admitting: Internal Medicine

## 2023-11-01 ENCOUNTER — Other Ambulatory Visit: Payer: Self-pay | Admitting: Family Medicine

## 2023-11-01 DIAGNOSIS — I10 Essential (primary) hypertension: Secondary | ICD-10-CM

## 2023-12-06 ENCOUNTER — Encounter: Payer: Self-pay | Admitting: Physician Assistant

## 2023-12-06 IMAGING — CT CT CARDIAC CORONARY ARTERY CALCIUM SCORE
3 series · 14 of 20 positions shown, 16 images · non-contrast
Comparison: None Available.
COMPARISON: None Available.

Addendum:
EXAM:
OVER-READ INTERPRETATION  CT CHEST

The following report is a limited chest CT over-read performed by
radiologist Dr. Kidane Bol [REDACTED] on 04/11/2022.
The coronary calcium score interpretation by the cardiologist is
attached.
CLINICAL DATA: Cardiovascular Disease Risk stratification
Coronary Calcium Score
TECHNIQUE: A gated, non-contrast computed tomography scan of the heart was
performed using 3mm slice thickness. Axial images were analyzed on a
dedicated workstation. Calcium scoring of the coronary arteries was
performed using the Agatston method.

[Series 2: cascseq 2.0 sa36 70% (id) · axial · 0.39mm/px · z∈[-534,-454]mm · 4 of 68 slices shown]
[im 14/68  vessel]
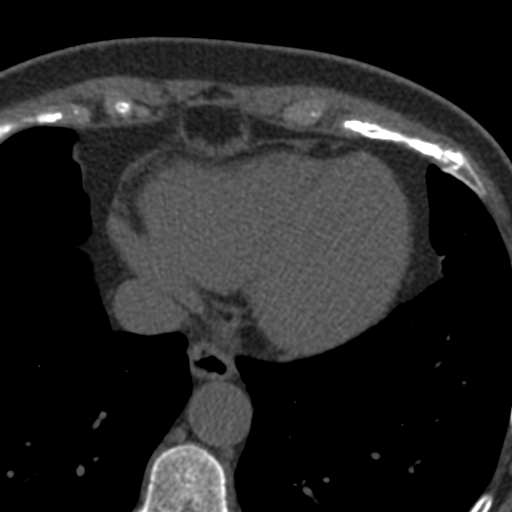
[im 27/68  vessel]
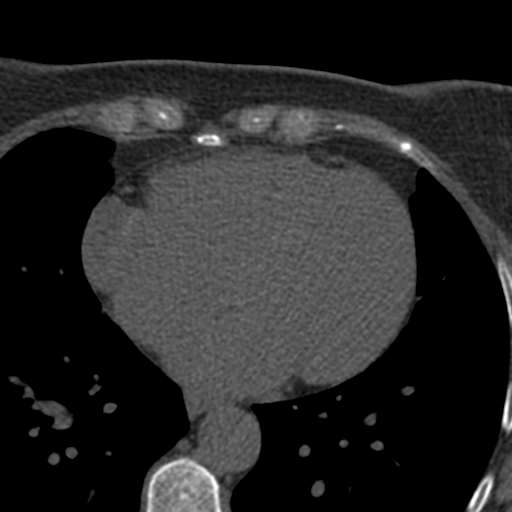
[im 41/68  vessel]
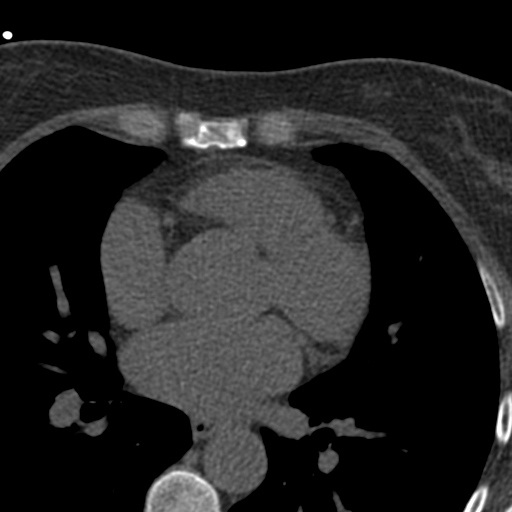
[im 54/68  vessel]
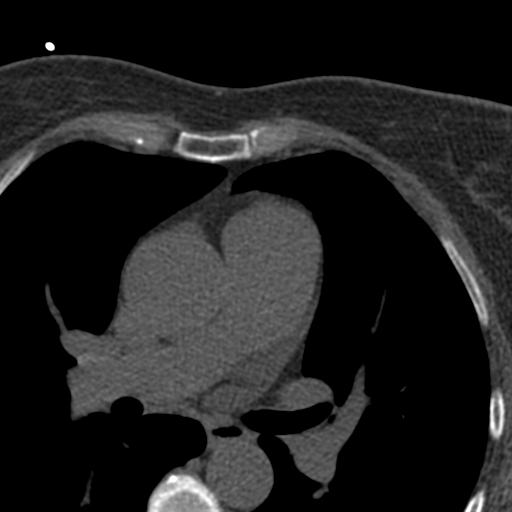

[Series 3: cascseq 2.0 bf37 st · axial · 0.65mm/px · z∈[-538,-450]mm · 5 of 68 slices shown, 7 images]
[im 12/68  vessel]
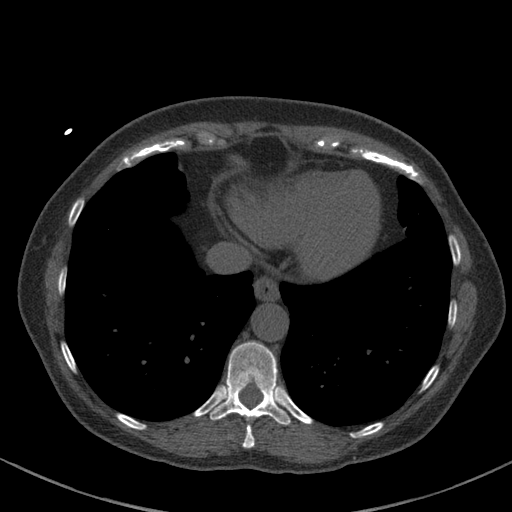
[im 12/68  lung]
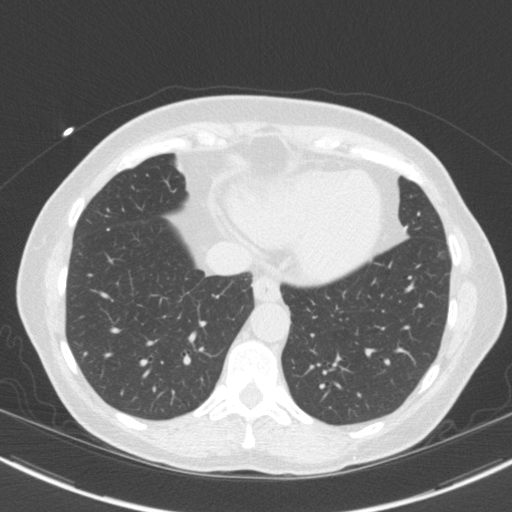
[im 23/68  vessel]
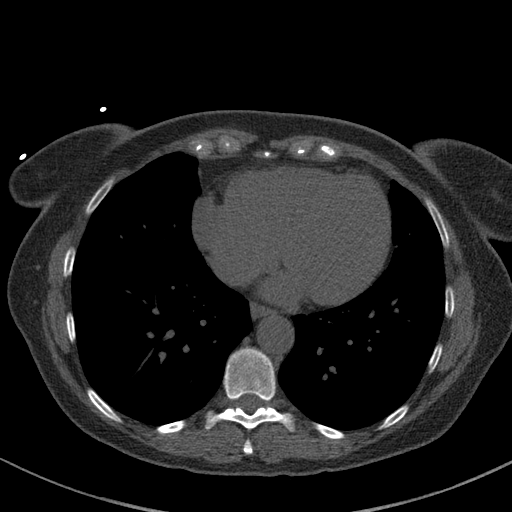
[im 34/68  vessel]
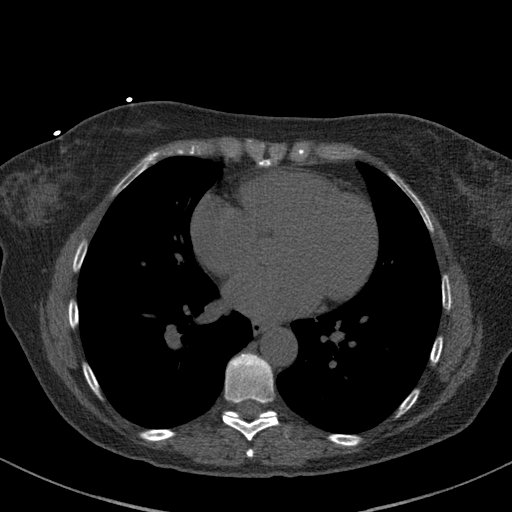
[im 45/68  vessel]
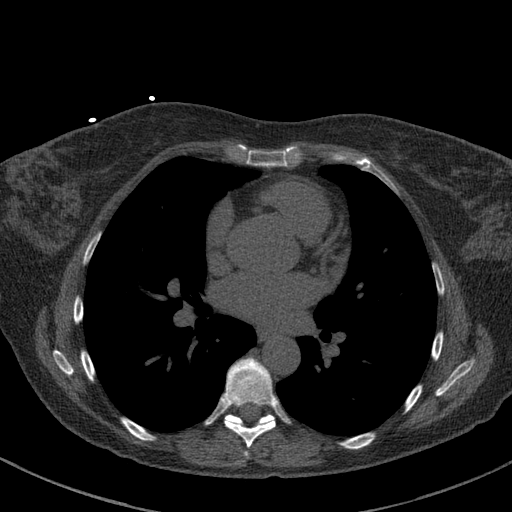
[im 56/68  vessel]
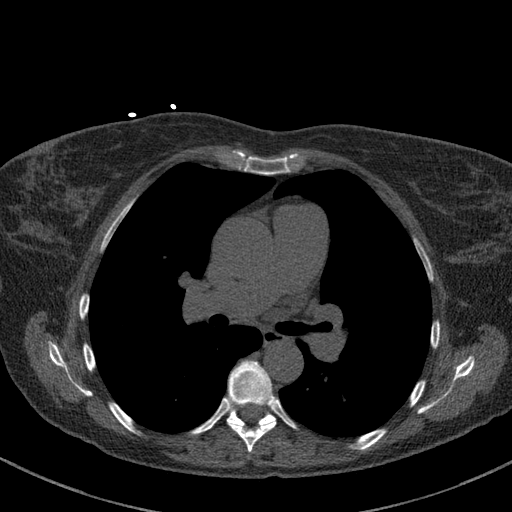
[im 56/68  lung]
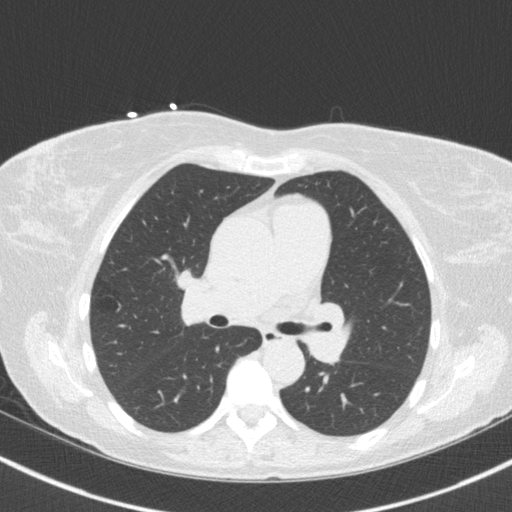

[Series 4: cascseq 2.0 br59 lung · axial · 0.65mm/px · z∈[-538,-450]mm · 5 of 68 slices shown]
[im 12/68  lung]
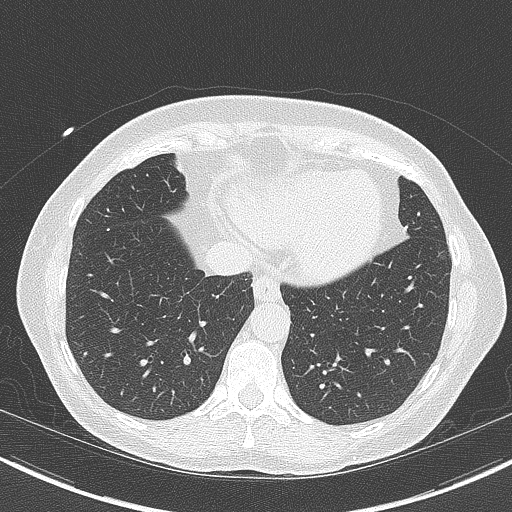
[im 23/68  lung]
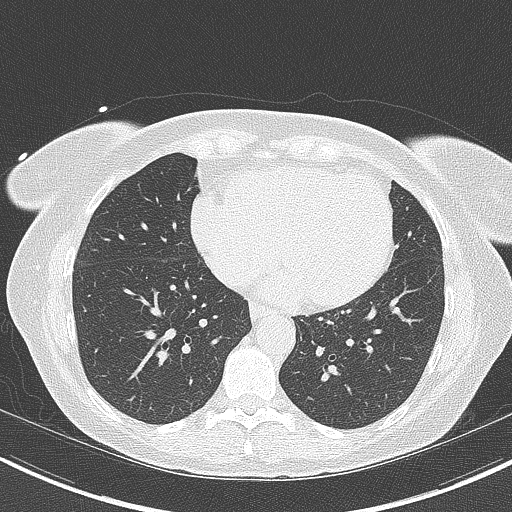
[im 34/68  lung]
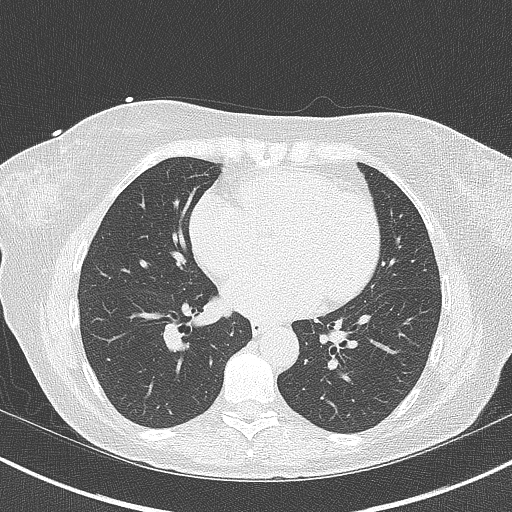
[im 45/68  lung]
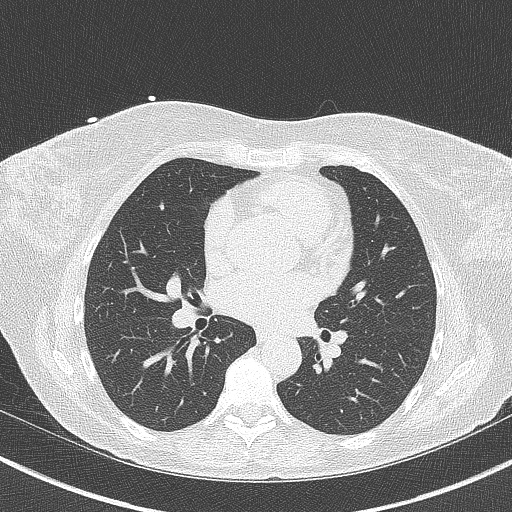
[im 56/68  lung]
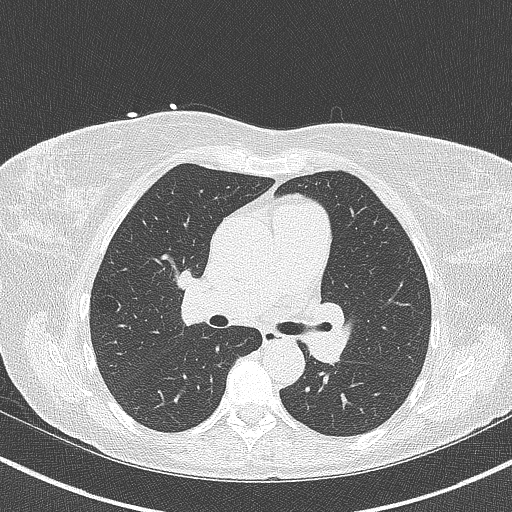

[14 of 20 positions shown; findings below may reference images not displayed]

FINDINGS: Vascular: Aneurysmal dilatation ascending thoracic aorta 4.1 cm
transverse image 16. No pericardial effusion.

Mediastinum/Nodes: Esophagus unremarkable.  No adenopathy.

Lungs/Pleura: Visualized lungs clear. No pulmonary infiltrate,
pleural effusion, or pneumothorax.

Upper Abdomen: Small cyst superiorly within liver 11 mm diameter
image 65. Remaining visualized upper abdomen unremarkable.

Musculoskeletal: No osseous abnormalities.
IMPRESSION: Aneurysmal dilatation of the ascending thoracic aorta 4.1 cm
transverse; Recommend annual imaging followup by CTA or MRA. This
recommendation follows 7434
ACCF/AHA/AATS/ACR/ASA/SCA/SANO/MADRAZO/QUINCEY/ADAME Guidelines for the
Diagnosis and Management of Patients with Thoracic Aortic Disease.
Circulation. 7434; 121: E266-e369. Aortic aneurysm NOS
(VFNQX-O8Z.J).

No additional intrathoracic abnormalities.
FINDINGS: Coronary arteries: Normal origins.

Coronary Calcium Score:

Left main: 0

Left anterior descending artery: 0

Left circumflex artery: 0

Right coronary artery: 0

Total: 0

Percentile: 0

Pericardium: Normal.

Aorta: DIlated to 41 mm at the main PA bifurcation.

Dilated main PA to >30 mm, suggestive of pulmonary hypertension.

Non-cardiac: See separate report from [REDACTED].
IMPRESSION: 1. Coronary calcium score of 0.
2. Aorta: DIlated to 41 mm at the main PA bifurcation.
3. Dilated main PA to >30 mm, suggestive of pulmonary hypertension.



If CAC=0, it is reasonable to withhold statin therapy and reassess
in 5 to 10 years, as long as higher risk conditions are absent
(diabetes mellitus, family history of premature CHD in first degree
relatives (males <55 years; females <65 years), cigarette smoking,
or LDL >=190 mg/dL).

If CAC is 1 to 99, it is reasonable to initiate statin therapy for
patients >=55 years of age.

If CAC is >=100 or >=75th percentile, it is reasonable to initiate
statin therapy at any age.

Cardiology referral should be considered for patients with CAC
scores >=400 or >=75th percentile.

*4155 AHA/ACC/AACVPR/AAPA/ABC/RUBEES/GIULI GIOVANA/THEMS/Ferienhaus/GULMOHAMMAD/ALVIN LIONEL/IXTIANDAR
Guideline on the Management of Blood Cholesterol: A Report of the
American College of Cardiology/American Heart Association Task Force
on Clinical Practice Guidelines. J Am Coll Cardiol.
0786;73(24):8608-8644.

*** End of Addendum ***
EXAM:
OVER-READ INTERPRETATION  CT CHEST

The following report is a limited chest CT over-read performed by
radiologist Dr. Kidane Bol [REDACTED] on 04/11/2022.
The coronary calcium score interpretation by the cardiologist is
attached.
FINDINGS: Vascular: Aneurysmal dilatation ascending thoracic aorta 4.1 cm
transverse image 16. No pericardial effusion.

Mediastinum/Nodes: Esophagus unremarkable.  No adenopathy.

Lungs/Pleura: Visualized lungs clear. No pulmonary infiltrate,
pleural effusion, or pneumothorax.

Upper Abdomen: Small cyst superiorly within liver 11 mm diameter
image 65. Remaining visualized upper abdomen unremarkable.

Musculoskeletal: No osseous abnormalities.
IMPRESSION: Aneurysmal dilatation of the ascending thoracic aorta 4.1 cm
transverse; Recommend annual imaging followup by CTA or MRA. This
recommendation follows 7434
ACCF/AHA/AATS/ACR/ASA/SCA/SANO/MADRAZO/QUINCEY/ADAME Guidelines for the
Diagnosis and Management of Patients with Thoracic Aortic Disease.
Circulation. 7434; 121: [id number]. Aortic aneurysm NOS
(VFNQX-O8Z.J).

No additional intrathoracic abnormalities.

## 2023-12-10 ENCOUNTER — Other Ambulatory Visit: Payer: Self-pay | Admitting: Thoracic Surgery (Cardiothoracic Vascular Surgery)

## 2023-12-10 DIAGNOSIS — I2699 Other pulmonary embolism without acute cor pulmonale: Secondary | ICD-10-CM

## 2024-01-02 ENCOUNTER — Encounter: Payer: Self-pay | Admitting: Family Medicine

## 2024-01-02 DIAGNOSIS — I2699 Other pulmonary embolism without acute cor pulmonale: Secondary | ICD-10-CM

## 2024-01-02 MED ORDER — APIXABAN 5 MG PO TABS
5.0000 mg | ORAL_TABLET | Freq: Two times a day (BID) | ORAL | 2 refills | Status: DC
Start: 2024-01-02 — End: 2024-02-23

## 2024-01-03 ENCOUNTER — Telehealth: Payer: Self-pay

## 2024-01-03 ENCOUNTER — Ambulatory Visit: Payer: Self-pay | Admitting: Physician Assistant

## 2024-01-03 ENCOUNTER — Encounter: Payer: Self-pay | Admitting: Physician Assistant

## 2024-01-03 VITALS — BP 130/80 | HR 88 | Ht 64.0 in | Wt 169.2 lb

## 2024-01-03 DIAGNOSIS — Z860101 Personal history of adenomatous and serrated colon polyps: Secondary | ICD-10-CM

## 2024-01-03 DIAGNOSIS — Z862 Personal history of diseases of the blood and blood-forming organs and certain disorders involving the immune mechanism: Secondary | ICD-10-CM | POA: Diagnosis not present

## 2024-01-03 DIAGNOSIS — Z7901 Long term (current) use of anticoagulants: Secondary | ICD-10-CM

## 2024-01-03 DIAGNOSIS — Z09 Encounter for follow-up examination after completed treatment for conditions other than malignant neoplasm: Secondary | ICD-10-CM

## 2024-01-03 DIAGNOSIS — Z86718 Personal history of other venous thrombosis and embolism: Secondary | ICD-10-CM

## 2024-01-03 MED ORDER — SUFLAVE 178.7 G PO SOLR: 1.0000 | Freq: Once | ORAL | 0 refills | Status: AC

## 2024-01-03 NOTE — Patient Instructions (Signed)
 You have been scheduled for a colonoscopy. Please follow written instructions given to you at your visit today.   If you use inhalers (even only as needed), please bring them with you on the day of your procedure.  DO NOT TAKE 7 DAYS PRIOR TO TEST- Trulicity (dulaglutide) Ozempic, Wegovy (semaglutide) Mounjaro (tirzepatide) Bydureon Bcise (exanatide extended release)  DO NOT TAKE 1 DAY PRIOR TO YOUR TEST Rybelsus (semaglutide) Adlyxin (lixisenatide) Victoza (liraglutide) Byetta (exanatide) ___________________________________________________________________________  Kaitlyn Garcia will receive your bowel preparation through [facility], which ensures the lowest copay and home delivery, with outreach via text or call from an [phone] number. Please respond promptly to avoid rescheduling of your procedure. If you are interested in alternative options or have any questions regarding your prep, please contact them at [phone] ____________________________________________________________________________  Your Provider Has Sent Your Bowel Prep Regimen To [facility]   [facility] will contact you to verify your information and collect your copay, if applicable. Enjoy the comfort of your home while your prescription is mailed to you, FREE of any shipping charges.   [facility] accepts all major insurance benefits and applies discounts & coupons.  Have additional questions?   Chat: [email or web address] Call: [phone] Email: [email or web address] NCPDP: [id number]  How will [facility] contact you?  With a Welcome phone call,  a Welcome text and a checkout link in text form.  Texts you receive from [phone] Are NOT Spam.  *To set up delivery, you must complete the checkout process via link or speak to one of the patient care representatives. If [facility] is unable to reach you, your prescription may be delayed.  To avoid long hold times on the phone, you may also utilize the secure chat  feature on the [facility] website to request that they call you back for transaction completion or to expedite your concerns.   Thank you for trusting me with your gastrointestinal care!  Kaitlyn Meeker, PA-C   _______________________________________________________  If your blood pressure at your visit was 140/90 or greater, please contact your primary care physician to follow up on this.  _______________________________________________________  If you are age 19 or older, your body mass index should be between 23-30. Your Body mass index is 29.05 kg/m. If this is out of the aforementioned range listed, please consider follow up with your Primary Care Provider.  If you are age 73 or younger, your body mass index should be between 19-25. Your Body mass index is 29.05 kg/m. If this is out of the aformentioned range listed, please consider follow up with your Primary Care Provider.   ________________________________________________________  The [facility] providers would like to encourage you to use [facility] to communicate with providers for non-urgent requests or questions.  Due to long hold times on the telephone, sending your provider a message by [facility] may be a faster and more efficient way to get a response.  Please allow 48 business hours for a response.  Please remember that this is for non-urgent requests.  _______________________________________________________

## 2024-01-03 NOTE — Progress Notes (Signed)
 Chief Complaint: History of colon polyps  HPI:    Kaitlyn Garcia is a  63 y/o female with a past medical history as listed below including anxiety, DVT and factor V Leiden on Eliquis, known to Dr. Rhea Belton, who was referred to me by Karie Georges, MD for consideration of a repeat colonoscopy for history of colon polyps.    10/11/2018 colonoscopy for history of nonadvanced adenoma in February 2013 with findings of one 5 mm polyp in the cecum, one 5 mm polyp in the ascending colon, one 6 mm polyp at the [facility] flexure and two 3-4 mm polyps in the transverse colon as well as one 5 mm polyp in the sigmoid colon and diverticulosis.  As well as small internal hemorrhoids.  Leeroy Bock showed mixture of tubular adenomas and hyperplastic polyps.  Repeat colonoscopy recommended in 5 years.    Today, patient presents to clinic and tells me that she is due for repeat colonoscopy.  Describes that about a year ago now she was actually diagnosed with PE and DVT with her known history of factor V Leiden and started on Eliquis which she was told she has to take for her entire life.  Denies any acute GI complaints or concerns.    Denies fever, chills or weight loss.  Past Medical History:  Diagnosis Date   Adenoma    Anal fissure    Anxiety    COVID 11/2020   DVT (deep venous thrombosis) (HCC)    Factor V Leiden (HCC)    Gallstones    High cholesterol    History of pulmonary embolus (PE)    Hypertension    IBS (irritable bowel syndrome)    Pneumonia    Polyp, colonic    Seasonal affective disorder (HCC)     Past Surgical History:  Procedure Laterality Date   APPENDECTOMY  2000   CHOLECYSTECTOMY  2001   COLONOSCOPY     DILATION AND CURETTAGE OF UTERUS     INCISIONAL HERNIA REPAIR  2001   OOPHORECTOMY Left 2002   POLYPECTOMY     TONSILLECTOMY  1965    Current Outpatient Medications  Medication Sig Dispense Refill   amLODipine (NORVASC) 10 MG tablet TAKE 1 TABLET BY MOUTH EVERY DAY 90 tablet 1    apixaban (ELIQUIS) 5 MG TABS tablet Take 1 tablet (5 mg total) by mouth 2 (two) times daily. 60 tablet 2   Ascorbic Acid (VITAMIN C PO) Take 1,000 Units by mouth daily.     Cholecalciferol (VITAMIN D-3 PO) Take 2,000 Units by mouth daily.     Cyanocobalamin (B-12 PO) Take by mouth daily.     MAGNESIUM PO Take by mouth daily.     No current facility-administered medications for this visit.    Allergies as of 01/03/2024   (No Known Allergies)    Family History  Problem Relation Age of Onset   Clotting disorder Mother    Hypertension Mother    Osteoarthritis Mother    Depression Mother    High Cholesterol Mother    Diabetes Father    Hypertension Father    Heart attack Father 61   Heart disease Father    Breast cancer Sister 15   High blood pressure Brother    Arthritis Maternal Grandmother    Stroke Maternal Grandfather    Clotting disorder Paternal Grandmother    High blood pressure Paternal Grandmother    Hearing loss Paternal Grandmother    Hearing loss Paternal Actor  Heart attack Paternal Grandfather    Heart disease Paternal Grandfather    Esophageal cancer Neg Hx    Stomach cancer Neg Hx    Rectal cancer Neg Hx    Colon cancer Neg Hx     Social History   Socioeconomic History   Marital status: Married    Spouse name: Not on file   Number of children: 2   Years of education: Not on file   Highest education level: Bachelor's degree (e.g., BA, AB, BS)  Occupational History   Not on file  Tobacco Use   Smoking status: Never   Smokeless tobacco: Never  Vaping Use   Vaping status: Never Used  Substance and Sexual Activity   Alcohol use: Yes    Alcohol/week: 2.0 - 3.0 standard drinks of alcohol    Types: 2 - 3 Glasses of wine per week    Comment: 2-3 glasses of wine a week, STOPPED in 05/2023   Drug use: No   Sexual activity: Yes    Partners: Male    Birth control/protection: Post-menopausal  Other Topics Concern   Not on file  Social History  Narrative   Not on file   Social Drivers of Health   Financial Resource Strain: Low Risk  (10/03/2022)   Overall Financial Resource Strain (CARDIA)    Difficulty of Paying Living Expenses: Not hard at all  Food Insecurity: No Food Insecurity (10/03/2022)   Hunger Vital Sign    Worried About Running Out of Food in the Last Year: Never true    Ran Out of Food in the Last Year: Never true  Transportation Needs: No Transportation Needs (10/03/2022)   PRAPARE - Administrator, Civil Service (Medical): No    Lack of Transportation (Non-Medical): No  Physical Activity: Insufficiently Active (10/03/2022)   Exercise Vital Sign    Days of Exercise per Week: 4 days    Minutes of Exercise per Session: 30 min  Stress: No Stress Concern Present (10/03/2022)   [employer] of Occupational Health - Occupational Stress Questionnaire    Feeling of Stress : Only a little  Social Connections: Socially Integrated (10/03/2022)   Social Connection and Isolation Panel [NHANES]    Frequency of Communication with Friends and Family: More than three times a week    Frequency of Social Gatherings with Friends and Family: More than three times a week    Attends Religious Services: More than 4 times per year    Active Member of [employer] or Organizations: Yes    Attends Engineer, structural: More than 4 times per year    Marital Status: Married  Catering manager Violence: Not on file    Review of Systems:    Constitutional: No weight loss, fever or chills Skin: No rash  Cardiovascular: No chest pain Respiratory: No SOB Gastrointestinal: See HPI and otherwise negative Genitourinary: No dysuria  Neurological: No headache, dizziness or syncope Musculoskeletal: No new muscle or joint pain Hematologic: No bleeding  Psychiatric: No history of depression or anxiety   Physical Exam:  Vital signs: BP 130/80 (BP Location: Left Arm, Patient Position: Sitting, Cuff Size: Normal)   Pulse  88   Ht 5\' 4"  (1.626 m) Comment: height measured without shoes  Wt 169 lb 4 oz (76.8 kg)   LMP 01/24/2008 (Approximate) Comment: spotting  BMI 29.05 kg/m    Constitutional:   Pleasant Caucasian female appears to be in NAD, Well developed, Well nourished, alert and cooperative Head:  Normocephalic  and atraumatic. Eyes:   PEERL, EOMI. No icterus. Conjunctiva pink. Ears:  Normal auditory acuity. Neck:  Supple Throat: Oral cavity and pharynx without inflammation, swelling or lesion.  Respiratory: Respirations even and unlabored. Lungs clear to auscultation bilaterally.   No wheezes, crackles, or rhonchi.  Cardiovascular: Normal S1, S2. No MRG. Regular rate and rhythm. No peripheral edema, cyanosis or pallor.  Gastrointestinal:  Soft, nondistended, nontender. No rebound or guarding. Normal bowel sounds. No appreciable masses or hepatomegaly. Rectal:  Not performed.  Msk:  Symmetrical without gross deformities. Without edema, no deformity or joint abnormality.  Neurologic:  Alert and  oriented x4;  grossly normal neurologically.  Skin:   Dry and intact without significant lesions or rashes. Psychiatric: Demonstrates good judgement and reason without abnormal affect or behaviors.  RELEVANT LABS AND IMAGING: CBC    Component Value Date/Time   WBC 6.3 08/15/2023 0935   RBC 5.00 08/15/2023 0935   HGB 13.9 08/15/2023 0935   HGB 13.4 11/27/2015 1519   HCT 42.5 08/15/2023 0935   PLT 304.0 08/15/2023 0935   MCV 84.9 08/15/2023 0935   MCH 27.8 11/27/2015 1520   MCHC 32.7 08/15/2023 0935   RDW 14.7 08/15/2023 0935   LYMPHSABS 2.4 02/10/2023 0834   MONOABS 0.5 02/10/2023 0834   EOSABS 0.2 02/10/2023 0834   BASOSABS 0.1 02/10/2023 0834    CMP     Component Value Date/Time   NA 140 02/10/2023 0834   K 3.7 02/10/2023 0834   CL 105 02/10/2023 0834   CO2 26 02/10/2023 0834   GLUCOSE 110 (H) 02/10/2023 0834   BUN 14 02/10/2023 0834   CREATININE 0.77 02/10/2023 0834   CREATININE 0.67  11/27/2015 1520   CALCIUM 9.4 02/10/2023 0834   PROT 7.6 02/10/2023 0834   ALBUMIN 4.6 02/10/2023 0834   AST 28 02/10/2023 0834   ALT 35 02/10/2023 0834   ALKPHOS 89 02/10/2023 0834   BILITOT 0.8 02/10/2023 0834    Assessment: 1.  History of adenomatous polyps: Repeat colonoscopy recommended now 2.  Chronic anticoagulation for history of factor V Leiden and DVT/PE on Eliquis  Plan: 1.  Patient scheduled for a surveillance colonoscopy given history of adenomatous polyps in the [facility] with Dr. Rhea Belton.  To provide the patient a detailed list of risk for the procedure and she agrees to proceed. 2.  Patient advised to hold her Eliquis for 2 days prior to time of procedure.  We will communicate with Dr. Nira Conn who prescribes this medicine for her to ensure this is acceptable. 3.  Patient to follow in clinic per recommendations after time of procedure.  Hyacinth Meeker, PA-C [facility] 01/03/2024, 10:36 AM  Cc: Karie Georges, MD

## 2024-01-03 NOTE — Telephone Encounter (Signed)
 Ok to hold Eliquis for 48 hours prior to the procedure, then resume immediately after.

## 2024-01-03 NOTE — Telephone Encounter (Signed)
 [facility] Pre-operative Risk Assessment     Request for surgical clearance:     Endoscopy Procedure  What type of surgery is being performed?     Colon  When is this surgery scheduled?     03/04/24  What type of clearance is required ?   Pharmacy  Are there any medications that need to be held prior to surgery and how long? Eliquis 2days  Practice name and name of physician performing surgery?      [facility]  What is your office phone and fax number?      Phone- [phone]  Fax- [phone]  Anesthesia type (None, local, MAC, general) ?       MAC   Please route your response to [employer], CMA

## 2024-01-04 NOTE — Telephone Encounter (Signed)
 Lmtcb. (Re: request to hold Eliquis approved)

## 2024-01-04 NOTE — Telephone Encounter (Signed)
 I spoke to Kaitlyn Garcia and I advised her that Dr. Casimiro Needle approved the 2 day hold on her Eliquis.  I advised her that once her procedure is completed, Dr. Rhea Belton will let her know when to resume the medication.

## 2024-01-22 NOTE — Progress Notes (Signed)
 Addendum: Reviewed and agree with assessment and management plan. Asha Grumbine, Carie Caddy, MD

## 2024-02-13 ENCOUNTER — Ambulatory Visit: Payer: BC Managed Care – PPO | Admitting: Family Medicine

## 2024-02-23 ENCOUNTER — Ambulatory Visit (INDEPENDENT_AMBULATORY_CARE_PROVIDER_SITE_OTHER): Admitting: Family Medicine

## 2024-02-23 ENCOUNTER — Encounter: Payer: Self-pay | Admitting: Family Medicine

## 2024-02-23 VITALS — BP 130/80 | HR 85 | Temp 98.2°F | Ht 64.0 in | Wt 169.4 lb

## 2024-02-23 DIAGNOSIS — Z13220 Encounter for screening for lipoid disorders: Secondary | ICD-10-CM | POA: Diagnosis not present

## 2024-02-23 DIAGNOSIS — Z1231 Encounter for screening mammogram for malignant neoplasm of breast: Secondary | ICD-10-CM

## 2024-02-23 DIAGNOSIS — I2699 Other pulmonary embolism without acute cor pulmonale: Secondary | ICD-10-CM

## 2024-02-23 DIAGNOSIS — Z780 Asymptomatic menopausal state: Secondary | ICD-10-CM | POA: Diagnosis not present

## 2024-02-23 DIAGNOSIS — I10 Essential (primary) hypertension: Secondary | ICD-10-CM | POA: Diagnosis not present

## 2024-02-23 DIAGNOSIS — Z7901 Long term (current) use of anticoagulants: Secondary | ICD-10-CM | POA: Diagnosis not present

## 2024-02-23 LAB — COMPREHENSIVE METABOLIC PANEL WITH GFR
ALT: 25 U/L (ref 0–35)
AST: 19 U/L (ref 0–37)
Albumin: 4.9 g/dL (ref 3.5–5.2)
Alkaline Phosphatase: 83 U/L (ref 39–117)
BUN: 16 mg/dL (ref 6–23)
CO2: 26 meq/L (ref 19–32)
Calcium: 9.5 mg/dL (ref 8.4–10.5)
Chloride: 105 meq/L (ref 96–112)
Creatinine, Ser: 0.7 mg/dL (ref 0.40–1.20)
GFR: 90.97 mL/min (ref >60.00)
Glucose, Bld: 118 mg/dL — ABNORMAL HIGH (ref 70–99)
Potassium: 3.9 meq/L (ref 3.5–5.1)
Sodium: 139 meq/L (ref 135–145)
Total Bilirubin: 1 mg/dL (ref 0.2–1.2)
Total Protein: 7.9 g/dL (ref 6.0–8.3)

## 2024-02-23 LAB — LIPID PANEL
Cholesterol: 210 mg/dL — ABNORMAL HIGH (ref 0–200)
HDL: 62.1 mg/dL (ref >39.00)
LDL Cholesterol: 137 mg/dL — ABNORMAL HIGH (ref 0–99)
NonHDL: 148.22
Total CHOL/HDL Ratio: 3
Triglycerides: 56 mg/dL (ref 0.0–149.0)
VLDL: 11.2 mg/dL (ref 0.0–40.0)

## 2024-02-23 MED ORDER — AMLODIPINE BESYLATE 10 MG PO TABS: 10.0000 mg | ORAL_TABLET | Freq: Every day | ORAL | 1 refills | Status: DC

## 2024-02-23 MED ORDER — APIXABAN 5 MG PO TABS: 5.0000 mg | ORAL_TABLET | Freq: Two times a day (BID) | ORAL | 5 refills | Status: AC

## 2024-02-23 NOTE — Progress Notes (Unsigned)
 Established Patient Office Visit  Subjective   Patient ID: Kaitlyn Garcia, female    DOB: [birth year]  Age: 63 y.o. MRN: [id number]  Chief Complaint  Patient presents with   Medical Management of Chronic Issues    Pt is here for follow up today. She reports feeling well, no new symptoms or issues.   HTN -- BP in office performed and is well controlled. She  reports no side effects to the medications, no chest pain, SOB, dizziness or headaches. She has a BP cuff at home and is checking BP regularly, reports they are in the normal range.   Chronic anticoagulation -- pt reports no bleeding or other problems with the eliquis , reports no SOB, no leg swelling or other signs of blood clot. Needs refills today  I extensively reviewed her health maintenance and she is due for welcome to medicare visit which we will schedule.   Current Outpatient Medications  Medication Instructions   amLODipine  (NORVASC ) 10 mg, Oral, Daily   apixaban  (ELIQUIS ) 5 mg, Oral, 2 times daily   Ascorbic Acid (VITAMIN C PO) 1,000 Units, Daily   Cholecalciferol (VITAMIN D -3 PO) 2,000 Units, Daily   Cyanocobalamin (B-12 PO) Daily   MAGNESIUM PO Daily    Patient Active Problem List   Diagnosis Date Noted   Personal history of deep vein thrombosis 06/24/2023   Bursitis of left hip 08/16/2021   Heterozygous factor V Leiden mutation (HCC) 07/21/2019   Hypertension 08/13/2018   Ankle pain 07/17/2013      Review of Systems  All other systems reviewed and are negative.     Objective:     BP 130/80   Pulse 85   Temp 98.2 F (36.8 C) (Oral)   Ht 5\' 4"  (1.626 m)   Wt 169 lb 6.4 oz (76.8 kg)   LMP 01/24/2008 (Approximate) Comment: spotting  SpO2 98%   BMI 29.08 kg/m    Physical Exam Vitals reviewed.  Constitutional:      Appearance: Normal appearance. She is well-groomed and normal weight.  Cardiovascular:     Rate and Rhythm: Normal rate and regular rhythm.     Pulses: Normal pulses.     Heart  sounds: S1 normal and S2 normal.  Pulmonary:     Effort: Pulmonary effort is normal.     Breath sounds: Normal breath sounds and air entry.  Abdominal:     General: Bowel sounds are normal.  Musculoskeletal:     Right lower leg: No edema.     Left lower leg: No edema.  Neurological:     Mental Status: She is alert and oriented to person, place, and time. Mental status is at baseline.     Gait: Gait is intact.  Psychiatric:        Mood and Affect: Mood and affect normal.        Speech: Speech normal.        Behavior: Behavior normal.        Judgment: Judgment normal.      The 10-year ASCVD risk score (Arnett DK, et al., 2019) is: 7.5%    Assessment & Plan:  Primary hypertension Assessment & Plan: Current hypertension medications:       Sig   amLODipine  (NORVASC ) 10 MG tablet Take 1 tablet (10 mg total) by mouth daily.      Chronic, stable, BP is well controlled. Will continue the above medications as prescribed.   Orders: -     Comprehensive metabolic panel with  GFR; Future  Lipid screening -     Lipid panel; Future  Chronic anticoagulation -     CBC; Future  Postmenopausal state -     DG Bone Density; Future  Breast cancer screening by mammogram -     3D Screening Mammogram, Left and Right; Future  Hypertension, unspecified type Assessment & Plan: Current hypertension medications:       Sig   amLODipine  (NORVASC ) 10 MG tablet Take 1 tablet (10 mg total) by mouth daily.      Chronic, stable, BP is well controlled. Will continue the above medications as prescribed.   Orders: -     amLODIPine  Besylate; Take 1 tablet (10 mg total) by mouth daily.  Dispense: 90 tablet; Refill: 1  Pulmonary embolism, unspecified chronicity, unspecified pulmonary embolism type, unspecified whether acute cor pulmonale present (HCC) History of due to leiden factor V heterozygous mutation, no signs of blood clot on exam, oxygen levels normal, will refill her eliquis  rx and  check CBC for regular monitoring   -     Apixaban ; Take 1 tablet (5 mg total) by mouth 2 (two) times daily.  Dispense: 60 tablet; Refill: 5  Counseled patient on the prevnar-20, I spent 30 minutes reviewing pt's health maintenance and counseling about each test that is recommended. Will place orders and see her back for welcome to medicare visit.   Return in about 5 months (around 07/25/2024) for Welcome to Medicare visit-- please schedule with me in person before a block.    Aida House, MD

## 2024-02-23 NOTE — Patient Instructions (Signed)
 TdaP  (tetantus) booster is due this year  Consider RSV vaccine  Prevnar-20 also due   All can be done at any pharmacy in the area

## 2024-02-24 LAB — CBC
HCT: 43.4 % (ref 36.0–46.0)
Hemoglobin: 14.5 g/dL (ref 12.0–15.0)
MCHC: 33.3 g/dL (ref 30.0–36.0)
MCV: 84.6 fl (ref 78.0–100.0)
Platelets: 296 10*3/uL (ref 150.0–400.0)
RBC: 5.13 Mil/uL — ABNORMAL HIGH (ref 3.87–5.11)
RDW: 14.7 % (ref 11.5–15.5)
WBC: 6.6 10*3/uL (ref 4.0–10.5)

## 2024-02-26 ENCOUNTER — Encounter: Payer: Self-pay | Admitting: Family Medicine

## 2024-02-26 NOTE — Assessment & Plan Note (Signed)
 Current hypertension medications:       Sig   amLODipine  (NORVASC ) 10 MG tablet Take 1 tablet (10 mg total) by mouth daily.      Chronic, stable, BP is well controlled. Will continue the above medications as prescribed.

## 2024-03-04 ENCOUNTER — Ambulatory Visit (AMBULATORY_SURGERY_CENTER): Admitting: Internal Medicine

## 2024-03-04 ENCOUNTER — Encounter: Payer: Self-pay | Admitting: Internal Medicine

## 2024-03-04 VITALS — BP 147/90 | HR 79 | Temp 98.4°F | Resp 12 | Ht 64.0 in | Wt 169.4 lb

## 2024-03-04 DIAGNOSIS — D120 Benign neoplasm of cecum: Secondary | ICD-10-CM | POA: Diagnosis not present

## 2024-03-04 DIAGNOSIS — K648 Other hemorrhoids: Secondary | ICD-10-CM | POA: Diagnosis not present

## 2024-03-04 DIAGNOSIS — Z1211 Encounter for screening for malignant neoplasm of colon: Secondary | ICD-10-CM | POA: Diagnosis not present

## 2024-03-04 DIAGNOSIS — K5730 Diverticulosis of large intestine without perforation or abscess without bleeding: Secondary | ICD-10-CM | POA: Diagnosis not present

## 2024-03-04 DIAGNOSIS — Z860101 Personal history of adenomatous and serrated colon polyps: Secondary | ICD-10-CM

## 2024-03-04 MED ORDER — SODIUM CHLORIDE 0.9 % IV SOLN: 500.0000 mL | Freq: Once | INTRAVENOUS | Status: DC

## 2024-03-04 NOTE — Progress Notes (Signed)
 Pt's states no medical or surgical changes since previsit or office visit.

## 2024-03-04 NOTE — Progress Notes (Signed)
 Called to room to assist during endoscopic procedure.  Patient ID and intended procedure confirmed with present staff. Received instructions for my participation in the procedure from the performing physician.

## 2024-03-04 NOTE — Op Note (Signed)
 [facility] Patient Name: Kaitlyn Garcia Procedure Date: 03/04/2024 2:16 PM MRN: [id number] Endoscopist: Nannette Babe , MD, [id number] Age: 63 Referring MD:  Date of Birth: 1961-07-17 Gender: Female Account #: [id number] Procedure:                Colonoscopy Indications:              High risk colon cancer surveillance: Personal                            history of non-advanced adenoma, Last colonoscopy:                            December 2019 Medicines:                Monitored Anesthesia Care Procedure:                Pre-Anesthesia Assessment:                           - Prior to the procedure, a History and Physical                            was performed, and patient medications and                            allergies were reviewed. The patient's tolerance of                            previous anesthesia was also reviewed. The risks                            and benefits of the procedure and the sedation                            options and risks were discussed with the patient.                            All questions were answered, and informed consent                            was obtained. Prior Anticoagulants: The patient has                            taken Eliquis  (apixaban ), last dose was 2 days                            prior to procedure. ASA Grade Assessment: III - A                            patient with severe systemic disease. After                            reviewing the risks and benefits, the patient was  deemed in satisfactory condition to undergo the                            procedure.                           After obtaining informed consent, the colonoscope                            was passed under direct vision. Throughout the                            procedure, the patient's blood pressure, pulse, and                            oxygen saturations were monitored continuously. The                             Olympus Scope SN: [id number] was introduced through                            the anus and advanced to the cecum, identified by                            transillumination. The colonoscopy was performed                            without difficulty. The patient tolerated the                            procedure well. The quality of the bowel                            preparation was good. The ileocecal valve,                            appendiceal orifice, and rectum were photographed. Scope In: 2:27:26 PM Scope Out: 2:41:10 PM Scope Withdrawal Time: 0 hours 9 minutes 40 seconds  Total Procedure Duration: 0 hours 13 minutes 44 seconds  Findings:                 The digital rectal exam was normal.                           A 2 mm polyp was found in the cecum. The polyp was                            sessile. The polyp was removed with a cold snare.                            Resection and retrieval were complete.                           Multiple medium-mouthed and small-mouthed  diverticula were found in the sigmoid colon,                            descending colon, splenic flexure and hepatic                            flexure.                           Internal hemorrhoids were found during                            retroflexion. The hemorrhoids were small. Complications:            No immediate complications. Estimated Blood Loss:     Estimated blood loss: none. Impression:               - One 2 mm polyp in the cecum, removed with a cold                            snare. Resected and retrieved.                           - Moderate diverticulosis in the sigmoid colon, in                            the descending colon, at the splenic flexure and at                            the hepatic flexure.                           - Internal hemorrhoids. Recommendation:           - Patient has a contact number available for                            emergencies.  The signs and symptoms of potential                            delayed complications were discussed with the                            patient. Return to normal activities tomorrow.                            Written discharge instructions were provided to the                            patient.                           - Resume previous diet.                           - Continue present medications.                           -  Resume Eliquis  (apixaban ) at prior dose today.                            Refer to managing physician for further adjustment                            of therapy.                           - Await pathology results.                           - Repeat colonoscopy is recommended for                            surveillance. The colonoscopy date will be                            determined after pathology results from today's                            exam become available for review. Nannette Babe, MD 03/04/2024 2:45:23 PM This report has been signed electronically.

## 2024-03-04 NOTE — Patient Instructions (Addendum)
 Thank you for letting us  take care of your healthcare needs today. PLease see handouts given to you on Polyps, Diverticulosis and Hemorrhoids. Resume Eliquis  tonight.     YOU HAD AN ENDOSCOPIC PROCEDURE TODAY AT THE [facility]:   Refer to the procedure report that was given to you for any specific questions about what was found during the examination.  If the procedure report does not answer your questions, please call your gastroenterologist to clarify.  If you requested that your care partner not be given the details of your procedure findings, then the procedure report has been included in a sealed envelope for you to review at your convenience later.  YOU SHOULD EXPECT: Some feelings of bloating in the abdomen. Passage of more gas than usual.  Walking can help get rid of the air that was put into your GI tract during the procedure and reduce the bloating. If you had a lower endoscopy (such as a colonoscopy or flexible sigmoidoscopy) you may notice spotting of blood in your stool or on the toilet paper. If you underwent a bowel prep for your procedure, you may not have a normal bowel movement for a few days.  Please Note:  You might notice some irritation and congestion in your nose or some drainage.  This is from the oxygen used during your procedure.  There is no need for concern and it should clear up in a day or so.  SYMPTOMS TO REPORT IMMEDIATELY:  Following lower endoscopy (colonoscopy or flexible sigmoidoscopy):  Excessive amounts of blood in the stool  Significant tenderness or worsening of abdominal pains  Swelling of the abdomen that is new, acute  Fever of 100F or higher   For urgent or emergent issues, a gastroenterologist can be reached at any hour by calling [phone]. Do not use MyChart messaging for urgent concerns.    DIET:  We do recommend a small meal at first, but then you may proceed to your regular diet.  Drink plenty of fluids but you should  avoid alcoholic beverages for 24 hours.  ACTIVITY:  You should plan to take it easy for the rest of today and you should NOT DRIVE or use heavy machinery until tomorrow (because of the sedation medicines used during the test).    FOLLOW UP: Our staff will call the number listed on your records the next business day following your procedure.  We will call around 7:15- 8:00 am to check on you and address any questions or concerns that you may have regarding the information given to you following your procedure. If we do not reach you, we will leave a message.     If any biopsies were taken you will be contacted by phone or by letter within the next 1-3 weeks.  Please call us  at [phone] if you have not heard about the biopsies in 3 weeks.    SIGNATURES/CONFIDENTIALITY: You and/or your care partner have signed paperwork which will be entered into your electronic medical record.  These signatures attest to the fact that that the information above on your After Visit Summary has been reviewed and is understood.  Full responsibility of the confidentiality of this discharge information lies with you and/or your care-partner.

## 2024-03-04 NOTE — Progress Notes (Signed)
 To pacu, VSS. Report to [email or web address]

## 2024-03-04 NOTE — Progress Notes (Signed)
 GASTROENTEROLOGY PROCEDURE H&P NOTE   Primary Care Physician: Aida House, MD    Reason for Procedure:  History of adenomatous colon polyps and SSP  Plan:    Surveillance colonoscopy  Patient is appropriate for endoscopic procedure(s) in the ambulatory ([facility]) setting.  The nature of the procedure, as well as the risks, benefits, and alternatives were carefully and thoroughly reviewed with the patient. Ample time for discussion and questions allowed. The patient understood, was satisfied, and agreed to proceed.     HPI: Kaitlyn Garcia is a 63 y.o. female who presents for surveillance colonoscopy.  Medical history as below.  Tolerated the prep.  No recent chest pain or shortness of breath.  No abdominal pain today.  Eliquis  on hold x 2 days  Past Medical History:  Diagnosis Date   Adenoma    Anal fissure    Anxiety    COVID 11/2020   DVT (deep venous thrombosis) (HCC)    Factor V Leiden (HCC)    Gallstones    High cholesterol    History of pulmonary embolus (PE)    Hypertension    IBS (irritable bowel syndrome)    Pneumonia    Polyp, colonic    Seasonal affective disorder [facility])     Past Surgical History:  Procedure Laterality Date   APPENDECTOMY  2000   CHOLECYSTECTOMY  2001   COLONOSCOPY     DILATION AND CURETTAGE OF UTERUS     INCISIONAL HERNIA REPAIR  2001   OOPHORECTOMY Left 2002   POLYPECTOMY     TONSILLECTOMY  1965    Prior to Admission medications   Medication Sig Start Date End Date Taking? Authorizing Provider  amLODipine  (NORVASC ) 10 MG tablet Take 1 tablet (10 mg total) by mouth daily. 02/23/24  Yes Aida House, MD  Ascorbic Acid (VITAMIN C PO) Take 1,000 Units by mouth daily.   Yes [provider]  Cholecalciferol (VITAMIN D -3 PO) Take 2,000 Units by mouth daily.   Yes [provider]  Cyanocobalamin (B-12 PO) Take by mouth daily.   Yes [provider]  MAGNESIUM PO Take by mouth daily.   Yes [provider]  apixaban  (ELIQUIS ) 5 MG TABS tablet Take 1 tablet (5 mg total) by mouth 2 (two) times daily. 02/23/24   Aida House, MD    Current Outpatient Medications  Medication Sig Dispense Refill   amLODipine  (NORVASC ) 10 MG tablet Take 1 tablet (10 mg total) by mouth daily. 90 tablet 1   Ascorbic Acid (VITAMIN C PO) Take 1,000 Units by mouth daily.     Cholecalciferol (VITAMIN D -3 PO) Take 2,000 Units by mouth daily.     Cyanocobalamin (B-12 PO) Take by mouth daily.     MAGNESIUM PO Take by mouth daily.     apixaban  (ELIQUIS ) 5 MG TABS tablet Take 1 tablet (5 mg total) by mouth 2 (two) times daily. 60 tablet 5   Current Facility-Administered Medications  Medication Dose Route Frequency Provider Last Rate Last Admin   0.9 %  sodium chloride  infusion  500 mL Intravenous Once Maddyson Keil, Amber Bail, MD        Allergies as of 03/04/2024   (No Known Allergies)    Family History  Problem Relation Age of Onset   Clotting disorder Mother    Hypertension Mother    Osteoarthritis Mother    Depression Mother    High Cholesterol Mother    Diabetes Father    Hypertension Father  Heart attack Father 30   Heart disease Father    Breast cancer Sister 29   High blood pressure Brother    Hyperlipidemia Brother    Arthritis Maternal Grandmother    Stroke Maternal Grandfather    Clotting disorder Paternal Grandmother    High blood pressure Paternal Grandmother    Hearing loss Paternal Grandmother    Hearing loss Paternal Grandfather    Heart attack Paternal Grandfather    Heart disease Paternal Grandfather    Clotting disorder Daughter    Esophageal cancer Neg Hx    Stomach cancer Neg Hx    Rectal cancer Neg Hx    Colon cancer Neg Hx     Social History   Socioeconomic History   Marital status: Married    Spouse name: Not on file   Number of children: 2   Years of education: Not on file   Highest education level: Bachelor's degree (e.g., BA, AB, BS)  Occupational History    Occupation: retired Runner, broadcasting/film/video  Tobacco Use   Smoking status: Never   Smokeless tobacco: Never  Vaping Use   Vaping status: Never Used  Substance and Sexual Activity   Alcohol use: Yes    Alcohol/week: 2.0 - 3.0 standard drinks of alcohol    Types: 2 - 3 Glasses of wine per week    Comment: 0-1 glasses of wine a week,   Drug use: No   Sexual activity: Yes    Partners: Male    Birth control/protection: Post-menopausal  Other Topics Concern   Not on file  Social History Narrative   Not on file   Social Drivers of Health   Financial Resource Strain: Low Risk  (10/03/2022)   Overall Financial Resource Strain (CARDIA)    Difficulty of Paying Living Expenses: Not hard at all  Food Insecurity: No Food Insecurity (10/03/2022)   Hunger Vital Sign    Worried About Running Out of Food in the Last Year: Never true    Ran Out of Food in the Last Year: Never true  Transportation Needs: No Transportation Needs (10/03/2022)   PRAPARE - Administrator, Civil Service (Medical): No    Lack of Transportation (Non-Medical): No  Physical Activity: Insufficiently Active (10/03/2022)   Exercise Vital Sign    Days of Exercise per Week: 4 days    Minutes of Exercise per Session: 30 min  Stress: No Stress Concern Present (10/03/2022)   [employer] of Occupational Health - Occupational Stress Questionnaire    Feeling of Stress : Only a little  Social Connections: Socially Integrated (10/03/2022)   Social Connection and Isolation Panel [NHANES]    Frequency of Communication with Friends and Family: More than three times a week    Frequency of Social Gatherings with Friends and Family: More than three times a week    Attends Religious Services: More than 4 times per year    Active Member of [employer] or Organizations: Yes    Attends Engineer, structural: More than 4 times per year    Marital Status: Married  Catering manager Violence: Not on file    Physical Exam: Vital signs  in last 24 hours: @BP  (!) 154/98   Pulse 94   Temp 98.4 F (36.9 C) (Temporal)   Ht 5\' 4"  (1.626 m)   Wt 169 lb 6.4 oz (76.8 kg)   LMP 01/24/2008 (Approximate) Comment: spotting  SpO2 96%   BMI 29.08 kg/m  GEN: NAD EYE: Sclerae anicteric ENT: MMM  CV: Non-tachycardic Pulm: CTA b/l GI: Soft, NT/ND NEURO:  Alert & Oriented x 3   Laurell Pond, MD [facility]  03/04/2024 2:10 PM

## 2024-03-05 ENCOUNTER — Telehealth: Payer: Self-pay | Admitting: Lactation Services

## 2024-03-05 NOTE — Telephone Encounter (Signed)
No answer on follow up call. 

## 2024-03-07 LAB — SURGICAL PATHOLOGY

## 2024-03-11 ENCOUNTER — Ambulatory Visit: Payer: Self-pay | Admitting: Internal Medicine

## 2024-04-22 ENCOUNTER — Ambulatory Visit
Admission: RE | Admit: 2024-04-22 | Discharge: 2024-04-22 | Disposition: A | Discharge status: Home / Self Care | Source: Ambulatory Visit | Attending: Family Medicine | Admitting: Family Medicine

## 2024-04-22 DIAGNOSIS — Z1231 Encounter for screening mammogram for malignant neoplasm of breast: Secondary | ICD-10-CM

## 2024-05-01 ENCOUNTER — Ambulatory Visit: Payer: Self-pay | Admitting: Family Medicine

## 2024-09-06 ENCOUNTER — Ambulatory Visit: Admitting: Family Medicine

## 2024-09-06 ENCOUNTER — Encounter: Payer: Self-pay | Admitting: Family Medicine

## 2024-09-06 VITALS — BP 130/78 | HR 75 | Temp 97.8°F | Ht 65.0 in | Wt 170.8 lb

## 2024-09-06 DIAGNOSIS — R739 Hyperglycemia, unspecified: Secondary | ICD-10-CM | POA: Diagnosis not present

## 2024-09-06 DIAGNOSIS — Z7901 Long term (current) use of anticoagulants: Secondary | ICD-10-CM

## 2024-09-06 DIAGNOSIS — Z0000 Encounter for general adult medical examination without abnormal findings: Secondary | ICD-10-CM | POA: Diagnosis not present

## 2024-09-06 LAB — CBC WITH DIFFERENTIAL/PLATELET
Basophils Absolute: 0.1 K/uL (ref 0.0–0.1)
Basophils Relative: 0.9 % (ref 0.0–3.0)
Eosinophils Absolute: 0.2 K/uL (ref 0.0–0.7)
Eosinophils Relative: 2.7 % (ref 0.0–5.0)
HCT: 40.7 % (ref 36.0–46.0)
Hemoglobin: 13.8 g/dL (ref 12.0–15.0)
Lymphocytes Relative: 39.3 % (ref 12.0–46.0)
Lymphs Abs: 2.5 K/uL (ref 0.7–4.0)
MCHC: 33.9 g/dL (ref 30.0–36.0)
MCV: 83.5 fl (ref 78.0–100.0)
Monocytes Absolute: 0.5 K/uL (ref 0.1–1.0)
Monocytes Relative: 8.6 % (ref 3.0–12.0)
Neutro Abs: 3.1 K/uL (ref 1.4–7.7)
Neutrophils Relative %: 48.5 % (ref 43.0–77.0)
Platelets: 316 K/uL (ref 150.0–400.0)
RBC: 4.88 Mil/uL (ref 3.87–5.11)
RDW: 14.2 % (ref 11.5–15.5)
WBC: 6.3 K/uL (ref 4.0–10.5)

## 2024-09-06 NOTE — Progress Notes (Signed)
 Chief Complaint  Patient presents with   Medicare Wellness     Subjective:   Kaitlyn Garcia is a 63 y.o. female who presents for a Welcome to Medicare Exam.   Allergies (verified) Patient has no known allergies.   History: Past Medical History:  Diagnosis Date   Adenoma    Anal fissure    Anxiety    COVID 11/2020   DVT (deep venous thrombosis) (HCC)    Factor V Leiden    Gallstones    High cholesterol    History of pulmonary embolus (PE)    Hypertension    IBS (irritable bowel syndrome)    Pneumonia    Polyp, colonic    Seasonal affective disorder    Past Surgical History:  Procedure Laterality Date   APPENDECTOMY  2000   CHOLECYSTECTOMY  2001   COLONOSCOPY     DILATION AND CURETTAGE OF UTERUS     INCISIONAL HERNIA REPAIR  2001   OOPHORECTOMY Left 2002   POLYPECTOMY     TONSILLECTOMY  1965   Family History  Problem Relation Age of Onset   Clotting disorder Mother    Hypertension Mother    Osteoarthritis Mother    Depression Mother    High Cholesterol Mother    Diabetes Father    Hypertension Father    Heart attack Father 68   Heart disease Father    Breast cancer Sister 96   High blood pressure Brother    Hyperlipidemia Brother    Arthritis Maternal Grandmother    Stroke Maternal Grandfather    Clotting disorder Paternal Grandmother    High blood pressure Paternal Grandmother    Hearing loss Paternal Grandmother    Hearing loss Paternal Grandfather    Heart attack Paternal Grandfather    Heart disease Paternal Grandfather    Clotting disorder Daughter    Esophageal cancer Neg Hx    Stomach cancer Neg Hx    Rectal cancer Neg Hx    Colon cancer Neg Hx    Social History   Occupational History   Occupation: retired runner, broadcasting/film/video  Tobacco Use   Smoking status: Never   Smokeless tobacco: Never  Vaping Use   Vaping status: Never Used  Substance and Sexual Activity   Alcohol use: Yes    Alcohol/week: 1.0 standard drink of alcohol    Types: 1  Glasses of wine per week    Comment: 0-1 glasses of wine a week,   Drug use: No   Sexual activity: Yes    Partners: Male    Birth control/protection: Post-menopausal   Tobacco Counseling Counseling given: Not Answered  SDOH Screenings   Food Insecurity: No Food Insecurity (09/06/2024)  Housing: Unknown (09/06/2024)  Transportation Needs: No Transportation Needs (09/06/2024)  Utilities: Not At Risk (09/06/2024)  Alcohol Screen: Low Risk  (09/06/2024)  Depression (PHQ2-9): Low Risk  (09/06/2024)  Financial Resource Strain: Low Risk  (09/06/2024)  Physical Activity: Unknown (09/06/2024)  Social Connections: Moderately Integrated (09/06/2024)  Stress: No Stress Concern Present (09/06/2024)  Tobacco Use: Low Risk  (09/06/2024)  Health Literacy: Adequate Health Literacy (09/06/2024)   See flowsheets for full screening details  Depression Screen PHQ 2 & 9 Depression Scale- Over the past 2 weeks, how often have you been bothered by any of the following problems? Little interest or pleasure in doing things: 0 Feeling down, depressed, or hopeless (PHQ Adolescent also includes...irritable): 0 PHQ-2 Total Score: 0 Trouble falling or staying asleep, or sleeping too much: 1 Feeling  tired or having little energy: 0 Poor appetite or overeating (PHQ Adolescent also includes...weight loss): 0 Feeling bad about yourself - or that you are a failure or have let yourself or your family down: 0 Trouble concentrating on things, such as reading the newspaper or watching television (PHQ Adolescent also includes...like school work): 0 Moving or speaking so slowly that other people could have noticed. Or the opposite - being so fidgety or restless that you have been moving around a lot more than usual: 0 Thoughts that you would be better off dead, or of hurting yourself in some way: 0 PHQ-9 Total Score: 1      Goals Addressed   None    Visit info / Clinical Intake: Medicare Wellness Visit Type::  Welcome to Medicare (IPPE) Persons participating in visit:: patient Medicare Wellness Visit Mode:: In-person (required for WTM) Information given by:: patient Interpreter Needed?: No Pre-visit prep was completed: no AWV questionnaire completed by patient prior to visit?: no Living arrangements:: lives with spouse/significant other Patient's Overall Health Status Rating: very good Typical amount of pain: none Does pain affect daily life?: no Are you currently prescribed opioids?: no  Dietary Habits and Nutritional Risks How many meals a day?: 3 Eats fruit and vegetables daily?: yes Most meals are obtained by: preparing own meals In the last 2 weeks, have you had any of the following?: none Diabetic:: no  Functional Status Activities of Daily Living (to include ambulation/medication): Independent Ambulation: Independent Medication Administration: Independent Home Management: Independent Manage your own finances?: yes Primary transportation is: driving Concerns about vision?: no *vision screening is required for WTM* Concerns about hearing?: no  Fall Screening Falls in the past year?: 0 Number of falls in past year: 0 Was there an injury with Fall?: 0 Fall Risk Category Calculator: 0 Patient Fall Risk Level: Low Fall Risk  Fall Risk Patient at Risk for Falls Due to: No Fall Risks Fall risk Follow up: Falls evaluation completed  Home and Transportation Safety: All rugs have non-skid backing?: yes All stairs or steps have railings?: yes Grab bars in the bathtub or shower?: yes Have non-skid surface in bathtub or shower?: (!) no Good home lighting?: yes Regular seat belt use?: yes Hospital stays in the last year:: no  Cognitive Assessment Difficulty concentrating, remembering, or making decisions? : no Will 6CIT or Mini Cog be Completed: yes What year is it?: 0 points What month is it?: 0 points Give patient an address phrase to remember (5 components): [address] [facility] [place] About what time is it?: 0 points Count backwards from 20 to 1: 0 points Say the months of the year in reverse: 0 points Repeat the address phrase from earlier: 0 points 6 CIT Score: 0 points  Advance Directives (For Healthcare) Does Patient Have a Medical Advance Directive?: Yes Does patient want to make changes to medical advance directive?: No - Patient declined Type of Advance Directive: Healthcare Power of Attorney Copy of Healthcare Power of Attorney in Chart?: No - copy requested  Reviewed/Updated  Reviewed/Updated: Reviewed All (Medical, Surgical, Family, Medications, Allergies, Care Teams, Patient Goals)         Objective:    Today's Vitals   09/06/24 0940  BP: 130/78  Pulse: 75  Temp: 97.8 F (36.6 C)  TempSrc: Oral  SpO2: 98%  Weight: 170 lb 12.8 oz (77.5 kg)  Height: 5' 5 (1.651 m)   Body mass index is 28.42 kg/m.   Physical Exam Vitals reviewed.  Constitutional:  Appearance: Normal appearance. She is well-groomed and normal weight.  HENT:     Right Ear: Tympanic membrane and ear canal normal.     Left Ear: Tympanic membrane and ear canal normal.     Mouth/Throat:     Mouth: Mucous membranes are moist.     Pharynx: No posterior oropharyngeal erythema.  Eyes:     Conjunctiva/sclera: Conjunctivae normal.  Neck:     Thyroid : No thyromegaly.  Cardiovascular:     Rate and Rhythm: Normal rate and regular rhythm.     Pulses: Normal pulses.     Heart sounds: S1 normal and S2 normal.  Pulmonary:     Effort: Pulmonary effort is normal.     Breath sounds: Normal breath sounds and air entry.  Abdominal:     General: Abdomen is flat. Bowel sounds are normal.     Palpations: Abdomen is soft.  Musculoskeletal:     Right lower leg: No edema.     Left lower leg: No edema.  Lymphadenopathy:     Cervical: No cervical adenopathy.  Neurological:     Mental Status: She is alert and oriented to person, place, and time. Mental status is at  baseline.     Gait: Gait is intact.  Psychiatric:        Mood and Affect: Mood and affect normal.        Speech: Speech normal.        Behavior: Behavior normal.        Judgment: Judgment normal.      Current Medications (verified) Outpatient Encounter Medications as of 09/06/2024  Medication Sig   amLODipine  (NORVASC ) 10 MG tablet Take 1 tablet (10 mg total) by mouth daily.   apixaban  (ELIQUIS ) 5 MG TABS tablet Take 1 tablet (5 mg total) by mouth 2 (two) times daily.   Cholecalciferol (VITAMIN D -3 PO) Take 2,000 Units by mouth daily.   Cyanocobalamin (B-12 PO) Take by mouth daily.   MAGNESIUM PO Take by mouth daily.   [DISCONTINUED] Ascorbic Acid (VITAMIN C PO) Take 1,000 Units by mouth daily.   No facility-administered encounter medications on file as of 09/06/2024.   Hearing/Vision screen Hearing Screening   500Hz  2000Hz  4000Hz   Right ear Pass Pass Pass  Left ear  Pass Pass   Vision Screening   Right eye Left eye Both eyes  Without correction     With correction 20/20 20/20    Immunizations and Health Maintenance Health Maintenance  Topic Date Due   Pneumococcal Vaccine: 50+ Years (1 of 1 - PCV) Never done   Cervical Cancer Screening (HPV/Pap Cotest)  09/25/2023   DTaP/Tdap/Td (2 - Td or Tdap) 11/09/2023   DEXA SCAN  Never done   COVID-19 Vaccine (4 - 2025-26 season) 06/24/2024   Influenza Vaccine  01/21/2025 (Originally 05/24/2024)   Mammogram  04/22/2025   Medicare Annual Wellness (AWV)  09/06/2025   Colonoscopy  03/05/2031   Hepatitis C Screening  Completed   HIV Screening  Completed   Zoster Vaccines- Shingrix   Completed   Hepatitis B Vaccines 19-59 Average Risk  Aged Out   Meningococcal B Vaccine  Aged Out    EKG: normal EKG, normal sinus rhythm, compared to previous EKG in 2021, no significant changes, HR is WNL.      Assessment/Plan:  This is a routine wellness examination for Vernon.  Patient Care Team: Ozell Heron HERO, MD as PCP - General (Family  Medicine) Cathlyn JAYSON Cary, Bobie BRAVO, MD as Consulting Physician (Obstetrics  and Gynecology) Charmayne Molly, MD as Consulting Physician (Ophthalmology) Kerrin Elspeth BROCKS, MD as Consulting Physician (Cardiothoracic Surgery)  I have personally reviewed and noted the following in the patient's chart:   Medical and social history Use of alcohol, tobacco or illicit drugs  Current medications and supplements including opioid prescriptions. Functional ability and status Nutritional status Physical activity Advanced directives List of other physicians Hospitalizations, surgeries, and ER visits in previous 12 months Vitals Screenings to include cognitive, depression, and falls Referrals and appointments  Orders Placed This Encounter  Procedures   CBC with Differential/Platelet    Standing Status:   Future    Expiration Date:   09/06/2025   Hemoglobin A1c    Standing Status:   Future    Expiration Date:   09/06/2025   EKG 12-Lead   In addition, I have reviewed and discussed with patient certain preventive protocols, quality metrics, and best practice recommendations. A written personalized care plan for preventive services as well as general preventive health recommendations were provided to patient.   Heron CHRISTELLA Sharper, MD   09/06/2024   Return in 1 year (on 09/06/2025).

## 2024-09-06 NOTE — Patient Instructions (Signed)
 Prevnar -20 vaccine is due  Tetanus booster is due  RSV vaccination

## 2024-09-09 ENCOUNTER — Ambulatory Visit (HOSPITAL_BASED_OUTPATIENT_CLINIC_OR_DEPARTMENT_OTHER)
Admission: RE | Admit: 2024-09-09 | Discharge: 2024-09-09 | Disposition: A | Discharge status: Home / Self Care | Source: Ambulatory Visit | Attending: Family Medicine | Admitting: Family Medicine

## 2024-09-09 DIAGNOSIS — Z780 Asymptomatic menopausal state: Secondary | ICD-10-CM | POA: Insufficient documentation

## 2024-09-09 LAB — HEMOGLOBIN A1C: Hgb A1c MFr Bld: 5.7 % (ref 4.6–6.5)

## 2024-09-10 ENCOUNTER — Ambulatory Visit: Payer: Self-pay | Admitting: Family Medicine

## 2024-10-31 ENCOUNTER — Other Ambulatory Visit: Payer: Self-pay | Admitting: Family Medicine

## 2024-10-31 DIAGNOSIS — I10 Essential (primary) hypertension: Secondary | ICD-10-CM

## 2024-11-15 ENCOUNTER — Other Ambulatory Visit

## 2025-09-08 ENCOUNTER — Ambulatory Visit
